# Patient Record
Sex: Female | Born: 1990 | Race: White | Hispanic: No | Marital: Single | Smoking: Former smoker
Health system: Southern US, Community
[De-identification: ages and names within clinical notes are randomized; demographics above are authoritative.]

## PROBLEM LIST (undated history)

## (undated) DIAGNOSIS — E039 Hypothyroidism, unspecified: Secondary | ICD-10-CM

## (undated) HISTORY — PX: NO PAST SURGERIES: SHX2092

---

## 2016-10-29 LAB — OB RESULTS CONSOLE GC/CHLAMYDIA
Chlamydia: NEGATIVE
Gonorrhea: NEGATIVE

## 2016-11-26 LAB — OB RESULTS CONSOLE RPR: RPR: NONREACTIVE

## 2016-11-26 LAB — OB RESULTS CONSOLE HEPATITIS B SURFACE ANTIGEN: HEP B S AG: NEGATIVE

## 2016-11-26 LAB — OB RESULTS CONSOLE ANTIBODY SCREEN: ANTIBODY SCREEN: NEGATIVE

## 2016-11-26 LAB — OB RESULTS CONSOLE ABO/RH: RH TYPE: POSITIVE

## 2016-11-26 LAB — OB RESULTS CONSOLE RUBELLA ANTIBODY, IGM: RUBELLA: NON-IMMUNE/NOT IMMUNE

## 2016-12-01 LAB — OB RESULTS CONSOLE HIV ANTIBODY (ROUTINE TESTING): HIV: NONREACTIVE

## 2017-02-24 NOTE — L&D Delivery Note (Signed)
Pt was admitted last pm secondary to bleeding. The induction was started this am. She had AROM with clear fluid. She progressed slowly to 9cm. Then pit aug was started when she did not have cervical change. She developed a temp during this time. She was given IV ampicillin. She completed the first stage and pushed for 30 min. She had a SVD of one live viable infant over a second degree midline tear. Nuchal cord x 1. Placenta -S/I . Amniotic fluid smelled of chorio. Second degree tear and labial tears closed with 3-0 chromic. Baby to nbn . EBL-400cc

## 2017-04-21 LAB — OB RESULTS CONSOLE GBS: GBS: NEGATIVE

## 2017-05-21 ENCOUNTER — Inpatient Hospital Stay (HOSPITAL_COMMUNITY)
Admission: AD | Admit: 2017-05-21 | Discharge: 2017-05-23 | DRG: 806 | Disposition: A | Discharge status: Home / Self Care | Payer: Medicaid Other | Source: Ambulatory Visit | Attending: Obstetrics and Gynecology | Admitting: Obstetrics and Gynecology

## 2017-05-21 ENCOUNTER — Inpatient Hospital Stay (HOSPITAL_COMMUNITY): Payer: Medicaid Other

## 2017-05-21 ENCOUNTER — Encounter (HOSPITAL_COMMUNITY): Payer: Self-pay

## 2017-05-21 ENCOUNTER — Inpatient Hospital Stay (HOSPITAL_COMMUNITY): Payer: Medicaid Other | Admitting: Anesthesiology

## 2017-05-21 DIAGNOSIS — O99284 Endocrine, nutritional and metabolic diseases complicating childbirth: Secondary | ICD-10-CM | POA: Diagnosis present

## 2017-05-21 DIAGNOSIS — O4693 Antepartum hemorrhage, unspecified, third trimester: Secondary | ICD-10-CM | POA: Diagnosis present

## 2017-05-21 DIAGNOSIS — Z3490 Encounter for supervision of normal pregnancy, unspecified, unspecified trimester: Secondary | ICD-10-CM

## 2017-05-21 DIAGNOSIS — E039 Hypothyroidism, unspecified: Secondary | ICD-10-CM | POA: Diagnosis present

## 2017-05-21 DIAGNOSIS — Z87891 Personal history of nicotine dependence: Secondary | ICD-10-CM | POA: Diagnosis not present

## 2017-05-21 DIAGNOSIS — Z3A39 39 weeks gestation of pregnancy: Secondary | ICD-10-CM

## 2017-05-21 HISTORY — DX: Hypothyroidism, unspecified: E03.9

## 2017-05-21 LAB — TYPE AND SCREEN
ABO/RH(D): O POS
Antibody Screen: NEGATIVE

## 2017-05-21 LAB — ABO/RH: ABO/RH(D): O POS

## 2017-05-21 LAB — CBC
HCT: 37 % (ref 36.0–46.0)
Hemoglobin: 12.9 g/dL (ref 12.0–15.0)
MCH: 30.9 pg (ref 26.0–34.0)
MCHC: 34.9 g/dL (ref 30.0–36.0)
MCV: 88.7 fL (ref 78.0–100.0)
PLATELETS: 257 10*3/uL (ref 150–400)
RBC: 4.17 MIL/uL (ref 3.87–5.11)
RDW: 13.5 % (ref 11.5–15.5)
WBC: 17.9 10*3/uL — AB (ref 4.0–10.5)

## 2017-05-21 LAB — RPR: RPR: NONREACTIVE

## 2017-05-21 MED ORDER — ONDANSETRON HCL 4 MG/2ML IJ SOLN
4.0000 mg | Freq: Four times a day (QID) | INTRAMUSCULAR | Status: DC | PRN
  Administered 2017-05-21: 4 mg via INTRAVENOUS
  Filled 2017-05-21: qty 2

## 2017-05-21 MED ORDER — COCONUT OIL OIL
1.0000 "application " | TOPICAL_OIL | Status: DC | PRN
  Administered 2017-05-23: 1 via TOPICAL
  Filled 2017-05-21: qty 120

## 2017-05-21 MED ORDER — LIDOCAINE HCL (PF) 1 % IJ SOLN
30.0000 mL | INTRAMUSCULAR | Status: DC | PRN
  Filled 2017-05-21: qty 30

## 2017-05-21 MED ORDER — ACETAMINOPHEN 325 MG PO TABS: 650.0000 mg | ORAL_TABLET | ORAL | Status: DC | PRN

## 2017-05-21 MED ORDER — ZOLPIDEM TARTRATE 5 MG PO TABS: 5.0000 mg | ORAL_TABLET | Freq: Every evening | ORAL | Status: DC | PRN

## 2017-05-21 MED ORDER — FENTANYL 2.5 MCG/ML BUPIVACAINE 1/10 % EPIDURAL INFUSION (WH - ANES)
14.0000 mL/h | INTRAMUSCULAR | Status: DC | PRN
  Administered 2017-05-21 (×2): 14 mL/h via EPIDURAL
  Filled 2017-05-21 (×2): qty 100

## 2017-05-21 MED ORDER — EPHEDRINE 5 MG/ML INJ: 10.0000 mg | INTRAVENOUS | Status: DC | PRN

## 2017-05-21 MED ORDER — OXYCODONE-ACETAMINOPHEN 5-325 MG PO TABS: 2.0000 | ORAL_TABLET | ORAL | Status: DC | PRN

## 2017-05-21 MED ORDER — OXYTOCIN BOLUS FROM INFUSION
500.0000 mL | Freq: Once | INTRAVENOUS | Status: AC
  Administered 2017-05-21: 500 mL via INTRAVENOUS

## 2017-05-21 MED ORDER — LIDOCAINE HCL (PF) 1 % IJ SOLN
INTRAMUSCULAR | Status: DC | PRN
  Administered 2017-05-21: 2 mL
  Administered 2017-05-21: 3 mL
  Administered 2017-05-21: 5 mL

## 2017-05-21 MED ORDER — LACTATED RINGERS IV SOLN: 500.0000 mL | Freq: Once | INTRAVENOUS | Status: DC

## 2017-05-21 MED ORDER — OXYCODONE-ACETAMINOPHEN 5-325 MG PO TABS: 1.0000 | ORAL_TABLET | ORAL | Status: DC | PRN

## 2017-05-21 MED ORDER — BUPIVACAINE HCL (PF) 0.25 % IJ SOLN
INTRAMUSCULAR | Status: DC | PRN
  Administered 2017-05-21: 5 mL via EPIDURAL

## 2017-05-21 MED ORDER — BENZOCAINE-MENTHOL 20-0.5 % EX AERO
1.0000 "application " | INHALATION_SPRAY | CUTANEOUS | Status: DC | PRN
  Filled 2017-05-21: qty 56

## 2017-05-21 MED ORDER — ONDANSETRON HCL 4 MG/2ML IJ SOLN: 4.0000 mg | INTRAMUSCULAR | Status: DC | PRN

## 2017-05-21 MED ORDER — LACTATED RINGERS IV SOLN
INTRAVENOUS | Status: DC
  Administered 2017-05-21: 04:00:00 via INTRAVENOUS

## 2017-05-21 MED ORDER — DIPHENHYDRAMINE HCL 50 MG/ML IJ SOLN: 12.5000 mg | INTRAMUSCULAR | Status: DC | PRN

## 2017-05-21 MED ORDER — PHENYLEPHRINE 40 MCG/ML (10ML) SYRINGE FOR IV PUSH (FOR BLOOD PRESSURE SUPPORT): 80.0000 ug | PREFILLED_SYRINGE | INTRAVENOUS | Status: DC | PRN

## 2017-05-21 MED ORDER — OXYTOCIN 40 UNITS IN LACTATED RINGERS INFUSION - SIMPLE MED
2.5000 [IU]/h | INTRAVENOUS | Status: DC
  Filled 2017-05-21: qty 1000

## 2017-05-21 MED ORDER — OXYTOCIN 10 UNIT/ML IJ SOLN
10.0000 [IU] | Freq: Once | INTRAMUSCULAR | Status: DC | PRN
  Filled 2017-05-21: qty 1

## 2017-05-21 MED ORDER — TETANUS-DIPHTH-ACELL PERTUSSIS 5-2.5-18.5 LF-MCG/0.5 IM SUSP: 0.5000 mL | Freq: Once | INTRAMUSCULAR | Status: DC

## 2017-05-21 MED ORDER — SENNOSIDES-DOCUSATE SODIUM 8.6-50 MG PO TABS
2.0000 | ORAL_TABLET | ORAL | Status: DC
  Administered 2017-05-22 (×2): 2 via ORAL
  Filled 2017-05-21: qty 2

## 2017-05-21 MED ORDER — SODIUM CHLORIDE 0.9 % IV SOLN
2.0000 g | Freq: Four times a day (QID) | INTRAVENOUS | Status: DC
  Administered 2017-05-21: 2 g via INTRAVENOUS
  Filled 2017-05-21: qty 2000
  Filled 2017-05-21: qty 2

## 2017-05-21 MED ORDER — ONDANSETRON HCL 4 MG PO TABS: 4.0000 mg | ORAL_TABLET | ORAL | Status: DC | PRN

## 2017-05-21 MED ORDER — FENTANYL CITRATE (PF) 100 MCG/2ML IJ SOLN
100.0000 ug | INTRAMUSCULAR | Status: DC | PRN
  Administered 2017-05-21: 100 ug via INTRAVENOUS
  Filled 2017-05-21: qty 2

## 2017-05-21 MED ORDER — EPHEDRINE 5 MG/ML INJ
10.0000 mg | INTRAVENOUS | Status: DC | PRN
  Filled 2017-05-21: qty 2

## 2017-05-21 MED ORDER — PHENYLEPHRINE 40 MCG/ML (10ML) SYRINGE FOR IV PUSH (FOR BLOOD PRESSURE SUPPORT)
80.0000 ug | PREFILLED_SYRINGE | INTRAVENOUS | Status: DC | PRN
  Filled 2017-05-21: qty 5

## 2017-05-21 MED ORDER — TERBUTALINE SULFATE 1 MG/ML IJ SOLN
0.2500 mg | Freq: Once | INTRAMUSCULAR | Status: DC | PRN
  Filled 2017-05-21: qty 1

## 2017-05-21 MED ORDER — IBUPROFEN 600 MG PO TABS
600.0000 mg | ORAL_TABLET | Freq: Four times a day (QID) | ORAL | Status: DC
  Administered 2017-05-22 – 2017-05-23 (×7): 600 mg via ORAL
  Filled 2017-05-21 (×6): qty 1

## 2017-05-21 MED ORDER — ACETAMINOPHEN 325 MG PO TABS
650.0000 mg | ORAL_TABLET | ORAL | Status: DC | PRN
  Administered 2017-05-21: 650 mg via ORAL
  Filled 2017-05-21: qty 2

## 2017-05-21 MED ORDER — OXYTOCIN 40 UNITS IN LACTATED RINGERS INFUSION - SIMPLE MED
1.0000 m[IU]/min | INTRAVENOUS | Status: DC
  Administered 2017-05-21: 2 m[IU]/min via INTRAVENOUS

## 2017-05-21 MED ORDER — LEVOTHYROXINE SODIUM 75 MCG PO TABS
75.0000 ug | ORAL_TABLET | Freq: Every day | ORAL | Status: DC
  Administered 2017-05-22 – 2017-05-23 (×2): 75 ug via ORAL
  Filled 2017-05-21 (×3): qty 1

## 2017-05-21 MED ORDER — LACTATED RINGERS IV SOLN
500.0000 mL | INTRAVENOUS | Status: DC | PRN
  Administered 2017-05-21: 1000 mL via INTRAVENOUS

## 2017-05-21 MED ORDER — WITCH HAZEL-GLYCERIN EX PADS: 1.0000 "application " | MEDICATED_PAD | CUTANEOUS | Status: DC | PRN

## 2017-05-21 MED ORDER — SIMETHICONE 80 MG PO CHEW: 80.0000 mg | CHEWABLE_TABLET | ORAL | Status: DC | PRN

## 2017-05-21 MED ORDER — DIBUCAINE 1 % RE OINT: 1.0000 "application " | TOPICAL_OINTMENT | RECTAL | Status: DC | PRN

## 2017-05-21 MED ORDER — SOD CITRATE-CITRIC ACID 500-334 MG/5ML PO SOLN: 30.0000 mL | ORAL | Status: DC | PRN

## 2017-05-21 MED ORDER — MEASLES, MUMPS & RUBELLA VAC ~~LOC~~ INJ
0.5000 mL | INJECTION | Freq: Once | SUBCUTANEOUS | Status: AC
  Administered 2017-05-23: 0.5 mL via SUBCUTANEOUS
  Filled 2017-05-21: qty 0.5

## 2017-05-21 MED ORDER — PHENYLEPHRINE 40 MCG/ML (10ML) SYRINGE FOR IV PUSH (FOR BLOOD PRESSURE SUPPORT)
80.0000 ug | PREFILLED_SYRINGE | INTRAVENOUS | Status: DC | PRN
  Filled 2017-05-21: qty 5
  Filled 2017-05-21: qty 10

## 2017-05-21 NOTE — Progress Notes (Signed)
10 instruments 5 big sponges 5 small sponges 2 injectables MO 

## 2017-05-21 NOTE — Anesthesia Procedure Notes (Signed)
Epidural Patient location during procedure: OB Start time: 05/21/2017 10:24 AM End time: 05/21/2017 10:29 AM  Staffing Anesthesiologist: Marcene DuosFitzgerald, Yania Bogie, MD Performed: anesthesiologist   Preanesthetic Checklist Completed: patient identified, site marked, surgical consent, pre-op evaluation, timeout performed, IV checked, risks and benefits discussed and monitors and equipment checked  Epidural Patient position: sitting Prep: site prepped and draped and DuraPrep Patient monitoring: continuous pulse ox and blood pressure Approach: midline Location: L4-L5 Injection technique: LOR air  Needle:  Needle type: Tuohy  Needle gauge: 17 G Needle length: 9 cm and 9 Needle insertion depth: 4.5 cm Catheter type: closed end flexible Catheter size: 19 Gauge Catheter at skin depth: 9.5 cm Test dose: negative  Assessment Events: blood not aspirated, injection not painful, no injection resistance, negative IV test and no paresthesia

## 2017-05-21 NOTE — MAU Provider Note (Signed)
History     CSN: [id number]  Arrival date and time: 05/21/17 [id number]   First Provider Initiated Contact with Patient 05/21/17 [phone]      Chief Complaint  Patient presents with  . Labor Eval   HPI  Ms.  Amber Mora is a 27 y.o. year old 532P0010 female at 3340w3d weeks gestation who presents to MAU reporting VB that she noticed when she woke up at 0100 this morning. She reports small clots in the toilet. She reports irregular UC's and decreased FM today. She also reports lower back pain and H/A on RT side. [facility] She was dilated 3 cm at her last OB visit.  Past Medical History:  Diagnosis Date  . Hypothyroidism     Past Surgical History:  Procedure Laterality Date  . NO PAST SURGERIES      Family History  Problem Relation Age of Onset  . Alcohol abuse Neg Hx   . Birth defects Neg Hx   . Arthritis Neg Hx   . Asthma Neg Hx   . Cancer Neg Hx   . COPD Neg Hx   . Drug abuse Neg Hx   . Early death Neg Hx   . Depression Neg Hx   . Diabetes Neg Hx   . Hearing loss Neg Hx   . Heart disease Neg Hx   . Hyperlipidemia Neg Hx   . Kidney disease Neg Hx   . Hypertension Neg Hx   . Learning disabilities Neg Hx   . Mental illness Neg Hx   . Mental retardation Neg Hx   . Stroke Neg Hx   . Miscarriages / Stillbirths Neg Hx   . Vision loss Neg Hx   . Varicose Veins Neg Hx     Social History   Tobacco Use  . Smoking status: Former Games developermoker  . Smokeless tobacco: Former Engineer, waterUser  Substance Use Topics  . Alcohol use: Not Currently    Frequency: Never  . Drug use: Not Currently    Allergies: No Known Allergies  Medications Prior to Admission  Medication Sig Dispense Refill Last Dose  . levothyroxine (SYNTHROID, LEVOTHROID) 75 MCG tablet Take 75 mcg by mouth daily before breakfast.   05/20/2017 at Unknown time  . Prenatal Vit-Fe Fumarate-FA (PRENATAL MULTIVITAMIN) TABS tablet Take 1 tablet by mouth daily at 12 noon.   05/20/2017 at Unknown time    Review of Systems   Constitutional: Negative.   HENT: Negative.   Eyes: Negative.   Respiratory: Negative.   Cardiovascular: Negative.   Gastrointestinal: Positive for abdominal pain.  Endocrine: Negative.   Genitourinary: Positive for pelvic pain and vaginal bleeding.  Musculoskeletal: Negative.   Skin: Negative.   Allergic/Immunologic: Negative.   Neurological: Negative.   Hematological: Negative.   Psychiatric/Behavioral: Negative.    Physical Exam   Blood pressure 108/79, pulse (!) 118, temperature 98.6 F (37 C), temperature source Oral.  Physical Exam  Nursing note and vitals reviewed. Constitutional: She is oriented to person, place, and time. She appears well-developed and well-nourished.  HENT:  Head: Normocephalic and atraumatic.  Eyes: Pupils are equal, round, and reactive to light.  Neck: Normal range of motion.  Cardiovascular: Normal rate, regular rhythm, normal heart sounds and intact distal pulses.  Respiratory: Effort normal and breath sounds normal.  GI: Soft. Bowel sounds are normal.  Genitourinary:  Genitourinary Comments: SE: no active bleeding, mucousy bloody show only Dilation: 4.0 Effacement (%): 80 Station: -2 Presentation: Vertex Exam by: Amber Mora. Amber Mora, CNM  Musculoskeletal: Normal range of motion.  Neurological: She is alert and oriented to person, place, and time. She has normal reflexes.  Skin: Skin is warm and dry.  Psychiatric: She has a normal mood and affect. Her behavior is normal. Judgment and thought content normal.    MAU Course  Procedures  MDM NST - FHR: 145 bpm / moderate variability / accels present / decels absent / TOCO: regular every 1-2 mins BPP: 6/8 (off for no breathing)   *Consult with Dr. Claiborne Billings @ [phone] - notified of patient's complaints, assessments, lab & U/S results - orders received to admit the pt, pt may have epidural upon request  Assessment and Plan  Indication for care in labor or delivery - Admit to L&D - Routine L&D  admission orders - May have epidural upon request   Amber Mora, MSN, CNM 05/21/2017, 2:23 AM

## 2017-05-21 NOTE — Anesthesia Preprocedure Evaluation (Signed)
Anesthesia Evaluation  Patient identified by MRN, date of birth, ID band Patient awake    Reviewed: Allergy & Precautions, Patient's Chart, lab work & pertinent test results  Airway Mallampati: II       Dental   Pulmonary former smoker,    Pulmonary exam normal        Cardiovascular negative cardio ROS Normal cardiovascular exam     Neuro/Psych negative neurological ROS     GI/Hepatic negative GI ROS, Neg liver ROS,   Endo/Other  Hypothyroidism   Renal/GU negative Renal ROS     Musculoskeletal   Abdominal   Peds  Hematology negative hematology ROS (+)   Anesthesia Other Findings   Reproductive/Obstetrics (+) Pregnancy                             Lab Results  Component Value Date   WBC 17.9 (H) 05/21/2017   HGB 12.9 05/21/2017   HCT 37.0 05/21/2017   MCV 88.7 05/21/2017   PLT 257 05/21/2017    Anesthesia Physical Anesthesia Plan  ASA: II  Anesthesia Plan: Epidural   Post-op Pain Management:    Induction:   PONV Risk Score and Plan: Treatment may vary due to age or medical condition  Airway Management Planned: Natural Airway  Additional Equipment:   Intra-op Plan:   Post-operative Plan:   Informed Consent: I have reviewed the patients History and Physical, chart, labs and discussed the procedure including the risks, benefits and alternatives for the proposed anesthesia with the patient or authorized representative who has indicated his/her understanding and acceptance.     Plan Discussed with:   Anesthesia Plan Comments:         Anesthesia Quick Evaluation

## 2017-05-21 NOTE — Anesthesia Pain Management Evaluation Note (Signed)
  CRNA Pain Management Visit Note  Patient: Desma MaximCaroline Scaduto, 27 y.o., female  "Hello [facility] am a [facility]. We have an anesthesia team available at all times to provide care throughout the hospital, including epidural management and anesthesia for C-section. I don't know your plan for the delivery whether it a natural birth, water birth, IV sedation, nitrous supplementation, doula or epidural, but we want to meet your pain goals."   1.Was your pain managed to your expectations on prior hospitalizations?   No prior hospitalizations  2.What is your expectation for pain management during this hospitalization?     Labor support without medications  3.How can we help you reach that goal? naturlal  Record the patient's initial score and the patient's pain goal.   Pain: 6  Pain Goal: 8 [facility] wants you to be able to say your pain was always managed very well.  Rasheen Bells 05/21/2017

## 2017-05-21 NOTE — H&P (Signed)
Pt is a  27 y.o. G2P0010 2229w3d white female who was admitted last PM with vaginal bleeding. PNC was complicated by hypothyroidism. She declined genetic testing. Nl OGTT Chief Complaint: HPI:  Past Medical History:  Diagnosis Date  . Hypothyroidism     Past Surgical History:  Procedure Laterality Date  . NO PAST SURGERIES      Family History  Problem Relation Age of Onset  . Alcohol abuse Neg Hx   . Birth defects Neg Hx   . Arthritis Neg Hx   . Asthma Neg Hx   . Cancer Neg Hx   . COPD Neg Hx   . Drug abuse Neg Hx   . Early death Neg Hx   . Depression Neg Hx   . Diabetes Neg Hx   . Hearing loss Neg Hx   . Heart disease Neg Hx   . Hyperlipidemia Neg Hx   . Kidney disease Neg Hx   . Hypertension Neg Hx   . Learning disabilities Neg Hx   . Mental illness Neg Hx   . Mental retardation Neg Hx   . Stroke Neg Hx   . Miscarriages / Stillbirths Neg Hx   . Vision loss Neg Hx   . Varicose Veins Neg Hx    Social History:  reports that she has quit smoking. She has quit using smokeless tobacco. She reports that she drank alcohol. She reports that she has current or past drug history.  Allergies: No Known Allergies  Medications Prior to Admission  Medication Sig Dispense Refill  . levothyroxine (SYNTHROID, LEVOTHROID) 75 MCG tablet Take 75-112.5 mcg by mouth daily before breakfast. 75mcg daily and 112.1025mcg (1.5 tablets) x 2 days per week.         Blood pressure (!) 109/47, pulse (!) 115, temperature (!) 102.9 F (39.4 C), temperature source Oral, resp. rate 18, height 5\' 5"  (1.651 m), weight 160 lb (72.6 kg), SpO2 98 %. General appearance: alert and cooperative Abdomen: gravid/nontender   Lab Results  Component Value Date   WBC 17.9 (H) 05/21/2017   HGB 12.9 05/21/2017   HCT 37.0 05/21/2017   MCV 88.7 05/21/2017   PLT 257 05/21/2017   No results found for: PREGTESTUR, PREGSERUM, HCG, HCGQUANT     Patient Active Problem List   Diagnosis Date Noted  . Indication  for care in labor or delivery 05/21/2017  IMP/ IUP at term Plan/ Start induction  Mishel Sans E 05/21/2017, 6:00 PM

## 2017-05-21 NOTE — MAU Note (Signed)
Pt presents to MAU c/o vaginal bleeding. Pt states around 0100 she woke up to use the bathroom and noticed some small clots of blood in the toilet. Pt reports irregular ctxs and decreased FM. Pt aslo reports lower back pain and a HA on her right side.

## 2017-05-22 LAB — CBC
HEMATOCRIT: 32.7 % — AB (ref 36.0–46.0)
HEMOGLOBIN: 11 g/dL — AB (ref 12.0–15.0)
MCH: 30.6 pg (ref 26.0–34.0)
MCHC: 33.6 g/dL (ref 30.0–36.0)
MCV: 90.8 fL (ref 78.0–100.0)
PLATELETS: 231 10*3/uL (ref 150–400)
RBC: 3.6 MIL/uL — AB (ref 3.87–5.11)
RDW: 14 % (ref 11.5–15.5)
WBC: 23.2 10*3/uL — AB (ref 4.0–10.5)

## 2017-05-22 NOTE — Anesthesia Postprocedure Evaluation (Signed)
Anesthesia Post Note  Patient: Amber Mora  Procedure(s) Performed: AN AD HOC LABOR EPIDURAL     Patient location during evaluation: Mother Baby Anesthesia Type: Epidural Level of consciousness: awake Pain management: satisfactory to patient Vital Signs Assessment: post-procedure vital signs reviewed and stable Respiratory status: spontaneous breathing Cardiovascular status: stable Anesthetic complications: no    Last Vitals:  Vitals:   05/22/17 0530 05/22/17 0814  BP: (!) 111/59 100/60  Pulse: 69 74  Resp: 18   Temp: 36.6 C 36.6 C  SpO2:  100%    Last Pain:  Vitals:   05/22/17 0815  TempSrc:   PainSc: 4    Pain Goal: Patients Stated Pain Goal: 5 (05/21/17 1100)               Cephus ShellingBURGER,Leya Paige

## 2017-05-22 NOTE — Progress Notes (Signed)
Post Partum Day 1 Subjective: no complaints, up ad lib, voiding and tolerating PO  Objective: Blood pressure 100/60, pulse 74, temperature 97.9 F (36.6 C), resp. rate 18, height 5\' 5"  (1.651 m), weight 72.6 kg (160 lb), SpO2 100 %, unknown if currently breastfeeding.  Physical Exam:  General: alert, cooperative and appears stated age Lochia: appropriate Uterine Fundus: firm   Recent Labs    05/21/17 0330 05/22/17 0622  HGB 12.9 11.0*  HCT 37.0 32.7*    Assessment/Plan: Plan for discharge tomorrow and Breastfeeding   LOS: 1 day   Amber Mora 05/22/2017, 9:22 AM

## 2017-05-22 NOTE — Lactation Note (Signed)
This note was copied from a baby's chart. Lactation Consultation Note  Patient Name: Amber Desma MaximCaroline Mora ZOXWR'UToday's Date: 05/22/2017 Reason for consult: Initial assessment;Term;[facility] Breastfeeding consultation services and support information given to patient.  Baby is 5118 hours old and has latched once for 30 minutes.  Baby in a quiet alert state now.  Positioned baby in football hold and mom expressed a drop of colostrum.  Mom has erect nipples and compressible breast. Baby took a few sucks off and on but would not sustain a latch.  Baby would not suck on gloved finger and brings her tongue to the roof of her mouth.  Showed mom suck training and encouraged her to do this.  A few drops expressed in spoon and given to baby.  I will follow up this afternoon and if no progress with latching set up a DEBP.  Maternal Data Has patient been taught Hand Expression?: Yes Does the patient have breastfeeding experience prior to this delivery?: No  Feeding Feeding Type: Breast Fed  LATCH Score Latch: Too sleepy or reluctant, no latch achieved, no sucking elicited.  Audible Swallowing: None  Type of Nipple: Everted at rest and after stimulation  Comfort (Breast/Nipple): Soft / non-tender  Hold (Positioning): Assistance needed to correctly position infant at breast and maintain latch.  LATCH Score: 5  Interventions Interventions: Breast feeding basics reviewed;Assisted with latch;Skin to skin;Adjust position;Breast massage;Support pillows;Hand express;Position options  Lactation Tools Discussed/Used     Consult Status Consult Status: Follow-up Date: 05/22/17 Follow-up type: In-patient    [name], Pearly Bartosik S 05/22/2017, 11:25 AM

## 2017-05-23 MED ORDER — IBUPROFEN 600 MG PO TABS: 600.0000 mg | ORAL_TABLET | Freq: Four times a day (QID) | ORAL | 0 refills | Status: AC | PRN

## 2017-05-23 MED ORDER — OXYCODONE-ACETAMINOPHEN 5-325 MG PO TABS: 1.0000 | ORAL_TABLET | Freq: Four times a day (QID) | ORAL | 0 refills | Status: AC | PRN

## 2017-05-23 MED ORDER — DOCUSATE SODIUM 100 MG PO CAPS: 100.0000 mg | ORAL_CAPSULE | Freq: Two times a day (BID) | ORAL | 0 refills | Status: AC

## 2017-05-23 NOTE — Lactation Note (Signed)
This note was copied from a baby's chart. Lactation Consultation Note  Patient Name: Amber Mora ZOXWR'UToday's Date: 05/23/2017 Reason for consult: Follow-up assessment  Mom and baby are going home today. She's a P1, baby is 2244 hours old and at 7% weight loss. Event though RN gave a latch score of 4, per mom BF is getting better, she voiced that baby did get to latch on the last feeding and that she heard swallows this time. Unable to assess the latch at this point since baby just fed at mom was already dressed waiting for her family to take her home.  Reviewed red flags to monitor to call baby's pediatrician. Discussed how a deep latch looks like, feels like and how important is for milk transfer. Engorgement prevention and treatment were also discussed. Offered OP appt, mom has LC OP # and will call if needed.   LATCH Score (per RN) Latch: Too sleepy or reluctant, no latch achieved, no sucking elicited.  Audible Swallowing: None  Type of Nipple: Everted at rest and after stimulation  Comfort (Breast/Nipple): Filling, red/small blisters or bruises, mild/mod discomfort  Hold (Positioning): Assistance needed to correctly position infant at breast and maintain latch.  LATCH Score: 4  Interventions Interventions: Breast feeding basics reviewed  Lactation Tools Discussed/Used     Consult Status Consult Status: PRN Follow-up type: Call as needed    [name] S [name] 05/23/2017, 1:16 PM

## 2017-05-23 NOTE — Discharge Summary (Signed)
Obstetric Discharge Summary Reason for Admission: onset of labor Prenatal Procedures: ultrasound Intrapartum Procedures: spontaneous vaginal delivery Postpartum Procedures: none Complications-Operative and Postpartum: 2nd degree perineal laceration Hemoglobin  Date Value Ref Range Status  05/22/2017 11.0 (L) 12.0 - 15.0 g/dL Final   HCT  Date Value Ref Range Status  05/22/2017 32.7 (L) 36.0 - 46.0 % Final    Physical Exam:  General: alert, cooperative and appears stated age 4Lochia: appropriate Uterine Fundus: firm Incision: healing well DVT Evaluation: No evidence of DVT seen on physical exam.  Discharge Diagnoses: Term Pregnancy-delivered  Discharge Information: Date: 05/23/2017 Activity: pelvic rest Diet: routine Medications: Ibuprofen, Colace and Percocet Condition: improved Instructions: refer to practice specific booklet Discharge to: home Follow-up Information    Levi AlandAnderson, Mark E, MD Follow up in 4 week(s).   Specialty:  Obstetrics and Gynecology Why:  For a postpartum evaluation Contact information: [address] [place] [phone]           Newborn Data: Live born female  Birth Weight: 7 lb 7.4 oz (3385 g) APGAR: 9, 9  Newborn Delivery   Birth date/time:  05/21/2017 17:05:00 Delivery type:  Vaginal, Spontaneous     Home with mother.  Amber Mora 05/23/2017, 11:41 AM

## 2018-01-06 ENCOUNTER — Emergency Department (HOSPITAL_COMMUNITY): Payer: Medicaid Other

## 2018-01-06 ENCOUNTER — Encounter (HOSPITAL_COMMUNITY): Payer: Self-pay | Admitting: Emergency Medicine

## 2018-01-06 ENCOUNTER — Emergency Department (HOSPITAL_COMMUNITY)
Admission: EM | Admit: 2018-01-06 | Discharge: 2018-01-06 | Disposition: A | Discharge status: Home / Self Care | Payer: Medicaid Other | Attending: Emergency Medicine | Admitting: Emergency Medicine

## 2018-01-06 ENCOUNTER — Other Ambulatory Visit: Payer: Self-pay

## 2018-01-06 DIAGNOSIS — M25531 Pain in right wrist: Secondary | ICD-10-CM | POA: Diagnosis present

## 2018-01-06 DIAGNOSIS — Z87891 Personal history of nicotine dependence: Secondary | ICD-10-CM | POA: Diagnosis not present

## 2018-01-06 DIAGNOSIS — Z79899 Other long term (current) drug therapy: Secondary | ICD-10-CM | POA: Diagnosis not present

## 2018-01-06 DIAGNOSIS — E039 Hypothyroidism, unspecified: Secondary | ICD-10-CM | POA: Diagnosis not present

## 2018-01-06 NOTE — Discharge Instructions (Addendum)
Today your x-rays did not show any evidence of broken bones.  As we discussed this is most likely related to inflammation.  Please take Ibuprofen (Advil, motrin) and Tylenol (acetaminophen) to relieve your pain.  You may take up to 600 MG (3 pills) of normal strength ibuprofen every 8 hours as needed.  In between doses of ibuprofen you make take tylenol, up to 1,000 mg (two extra strength pills).  Do not take more than 3,000 mg tylenol in a 24 hour period.  Please check all medication labels as many medications such as pain and cold medications may contain tylenol.  Do not drink alcohol while taking these medications.  Do not take other NSAID'S while taking ibuprofen (such as aleve or naproxen).  Please take ibuprofen with food to decrease stomach upset.

## 2018-01-06 NOTE — ED Triage Notes (Signed)
Patient complaining of right wrist pain that has been hurting for a couple months. Patient states she came because it is getting worse. Patient has not had an injury to her wrist.

## 2018-01-06 NOTE — ED Provider Notes (Signed)
[facility]-EMERGENCY DEPT Provider Note   CSN: [id number] Arrival date & time: 01/06/18  2120     History   Chief Complaint Chief Complaint  Patient presents with  . Wrist Pain    HPI Amber Mora is a 27 y.o. female who presents today for evaluation of right wrist pain.  She reports that for the past 4 months she has had waxing and waning pain at the dorsal base of her right thumb and her right wrist.  She has tried an Ace wrap which provided temporary mild relief however returned.  She denies any injury prior to the onset of her symptoms.  No fevers.  She does not have any other joint pains.  She indicates pain is   HPI  Past Medical History:  Diagnosis Date  . Hypothyroidism     Patient Active Problem List   Diagnosis Date Noted  . Indication for care in labor or delivery 05/21/2017  . Pregnancy 05/21/2017    Past Surgical History:  Procedure Laterality Date  . NO PAST SURGERIES       OB History    Gravida  2   Para  1   Term  1   Preterm      AB  1   Living  1     SAB      TAB      Ectopic      Multiple  0   Live Births  1            Home Medications    Prior to Admission medications   Medication Sig Start Date End Date Taking? Authorizing Provider  docusate sodium (COLACE) 100 MG capsule Take 1 capsule (100 mg total) by mouth 2 (two) times daily. 05/23/17   Waynard Reedsoss, Kendra, MD  ibuprofen (ADVIL,MOTRIN) 600 MG tablet Take 1 tablet (600 mg total) by mouth every 6 (six) hours as needed. 05/23/17   Waynard Reedsoss, Kendra, MD  levothyroxine (SYNTHROID, LEVOTHROID) 75 MCG tablet Take 75-112.5 mcg by mouth daily before breakfast. 75mcg daily and 112.825mcg (1.5 tablets) x 2 days per week.    [provider]  oxyCODONE-acetaminophen (PERCOCET/ROXICET) 5-325 MG tablet Take 1-2 tablets by mouth every 6 (six) hours as needed for severe pain. 05/23/17   Waynard Reedsoss, Kendra, MD    Family History Family History  Problem Relation Age of  Onset  . Alcohol abuse Neg Hx   . Birth defects Neg Hx   . Arthritis Neg Hx   . Asthma Neg Hx   . Cancer Neg Hx   . COPD Neg Hx   . Drug abuse Neg Hx   . Early death Neg Hx   . Depression Neg Hx   . Diabetes Neg Hx   . Hearing loss Neg Hx   . Heart disease Neg Hx   . Hyperlipidemia Neg Hx   . Kidney disease Neg Hx   . Hypertension Neg Hx   . Learning disabilities Neg Hx   . Mental illness Neg Hx   . Mental retardation Neg Hx   . Stroke Neg Hx   . Miscarriages / Stillbirths Neg Hx   . Vision loss Neg Hx   . Varicose Veins Neg Hx     Social History Social History   Tobacco Use  . Smoking status: Former Games developermoker  . Smokeless tobacco: Former Engineer, waterUser  Substance Use Topics  . Alcohol use: Not Currently    Frequency: Never  . Drug use: Not Currently  Allergies   Other   Review of Systems Review of Systems  Constitutional: Negative for chills and fever.  Musculoskeletal: Positive for arthralgias.  Neurological: Negative for weakness and numbness.  All other systems reviewed and are negative.    Physical Exam Updated Vital Signs BP 130/81 (BP Location: Right Arm)   Pulse (!) 58   Temp 98 F (36.7 C) (Oral)   Resp 16   Ht 5\' 6"  (1.676 m)   Wt 63.5 kg   LMP 12/13/2017   SpO2 100%   BMI 22.60 kg/m   Physical Exam  Constitutional: She appears well-developed. No distress.  Cardiovascular: Intact distal pulses.  2+ right radial pulse.  Brisk capillary refill to fingers of right hand.  Musculoskeletal:  She does not have any tenderness to palpation over the right wrist.  Her pain is reproduced with finkelstein test.  No crepitus or deformity.    Neurological:  5/5 grip strength in right hand.  Sensation intact to light touch on all fingers of right hand.  Skin: Skin is warm and dry. She is not diaphoretic.  No abnormal erythema, edema, or ecchymosis over the right hand.  Psychiatric: She has a normal mood and affect.  Nursing note and vitals  reviewed.    ED Treatments / Results  Labs (all labs ordered are listed, but only abnormal results are displayed) Labs Reviewed - No data to display  EKG None  Radiology Dg Wrist Complete Right  Result Date: 01/06/2018 CLINICAL DATA:  Right wrist pain for several months without known injury. EXAM: RIGHT WRIST - COMPLETE 3+ VIEW COMPARISON:  None. FINDINGS: There is no evidence of fracture or dislocation. There is no evidence of arthropathy or other focal bone abnormality. Soft tissues are unremarkable. IMPRESSION: Negative. Electronically Signed   By: Tollie Eth M.D.   On: 01/06/2018 22:10    Procedures Procedures (including critical care time)  Medications Ordered in ED Medications - No data to display   Initial Impression / Assessment and Plan / ED Course  I have reviewed the triage vital signs and the nursing notes.  Pertinent labs & imaging results that were available during my care of the patient were reviewed by me and considered in my medical decision making (see chart for details).    Patient presents today for evaluation of 4 months of waxing and waning pain in her right wrist.  X-rays were obtained which did not show evidence of acute abnormalities.  She has a positive Finkelstein test and history consistent with de Quervain's tenosynovitis.  She is given a removable Velcro thumb spica wrist splint, instructions on over-the-counter NSAIDs and Tylenol.  She is given follow-up with hand in case her symptoms fail to improve with conservative care.    Final Clinical Impressions(s) / ED Diagnoses   Final diagnoses:  Right wrist pain    ED Discharge Orders    None       Norman Clay 01/06/18 2336    Alvira Monday, MD 01/08/18 1106

## 2019-11-17 IMAGING — US US MFM FETAL BPP W/O NON-STRESS
1 series · 11 of 11 positions shown · non-contrast
Comparison: none

[Series 1: us mfm fetal bpp w/o non-stress · 11 acquisitions, 11 frames shown]
[im 1/11]
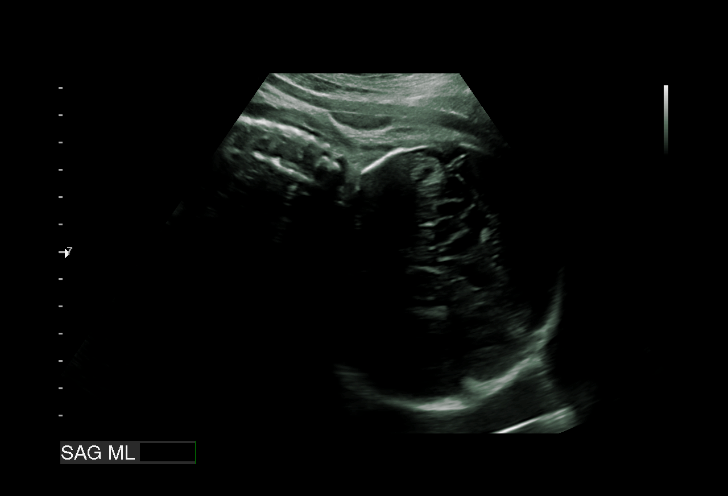
[im 2/11]
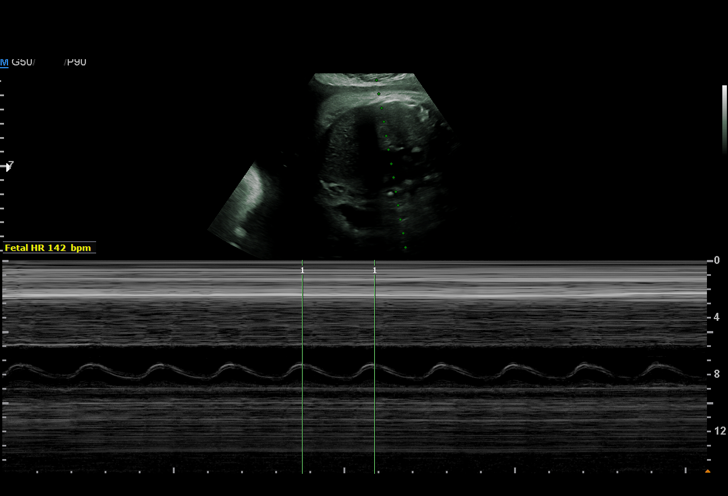
[im 3/11]
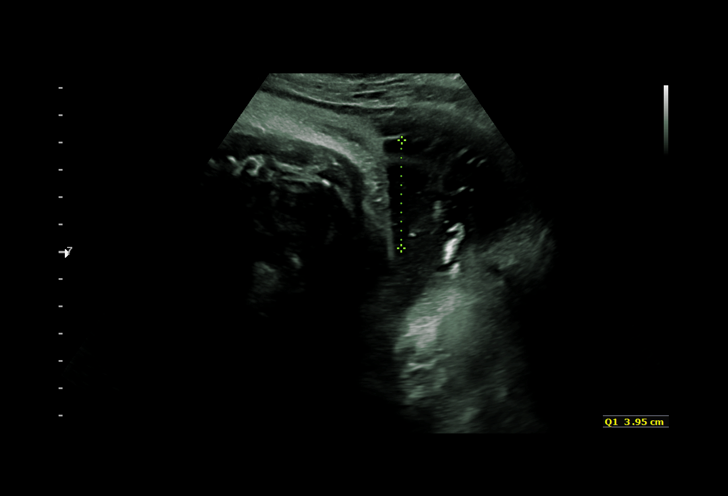
[im 4/11]
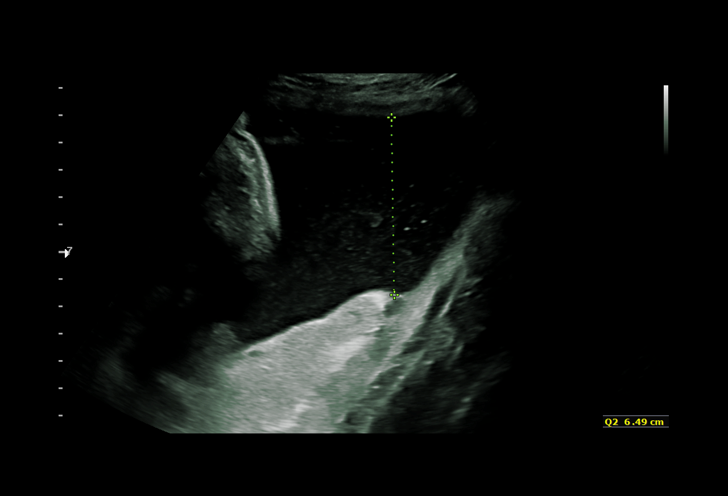
[im 5/11]
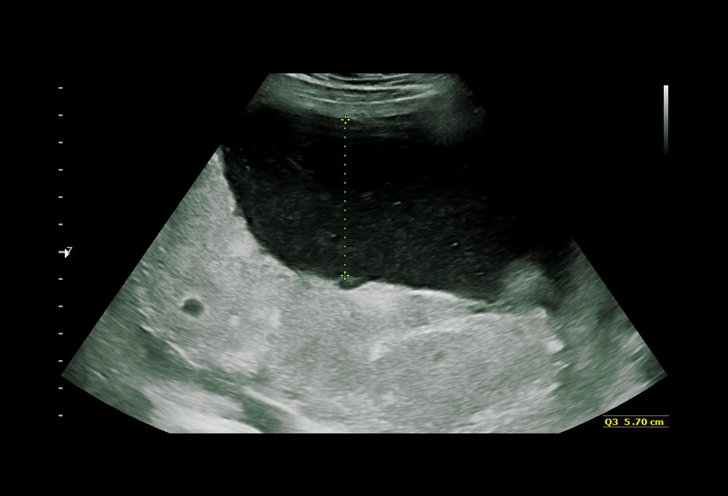
[im 6/11]
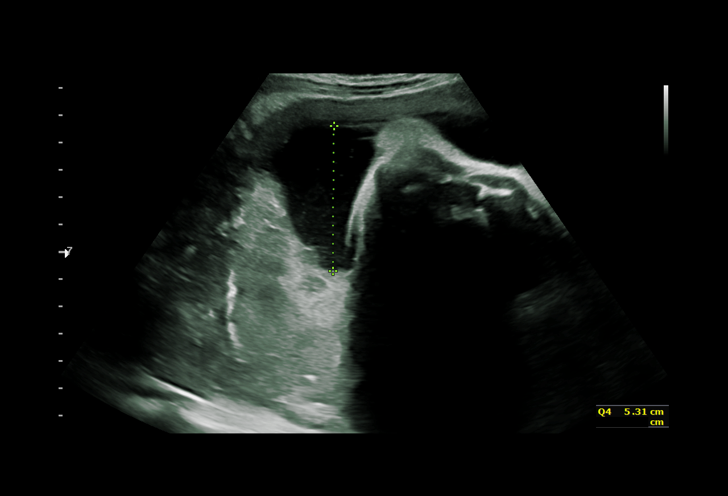
[im 7/11]
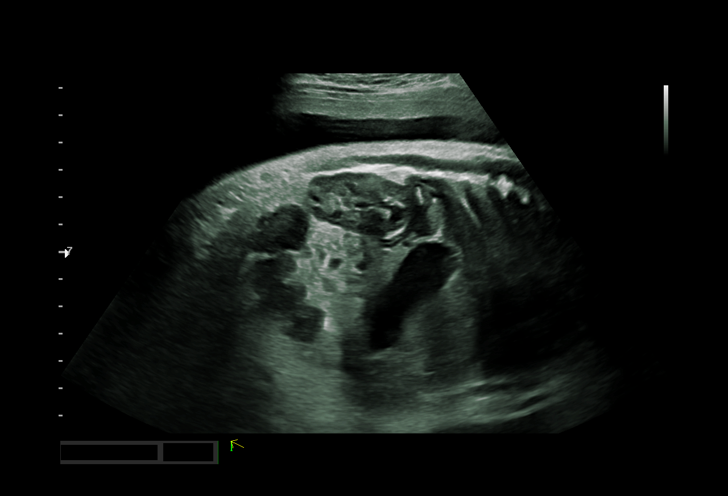
[im 8/11]
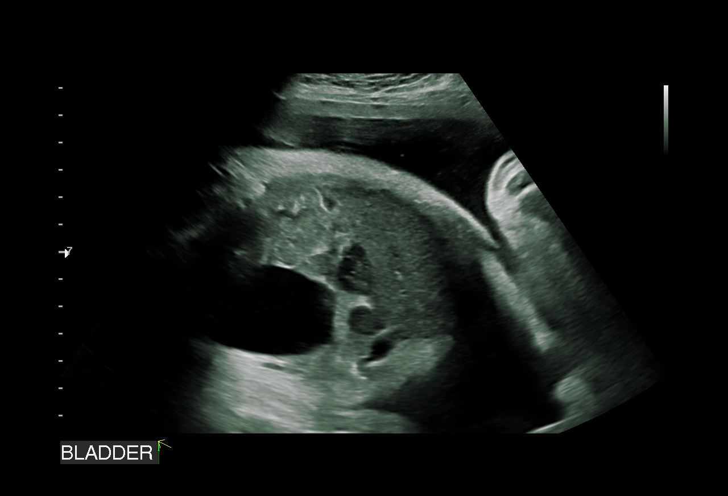
[im 9/11]
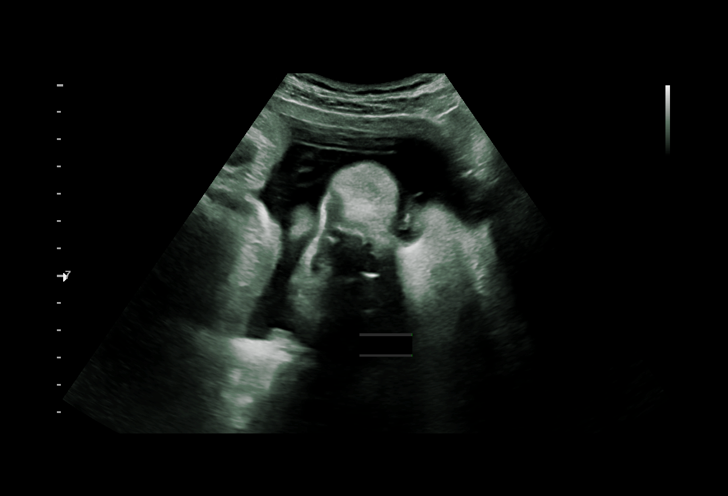
[im 10/11]
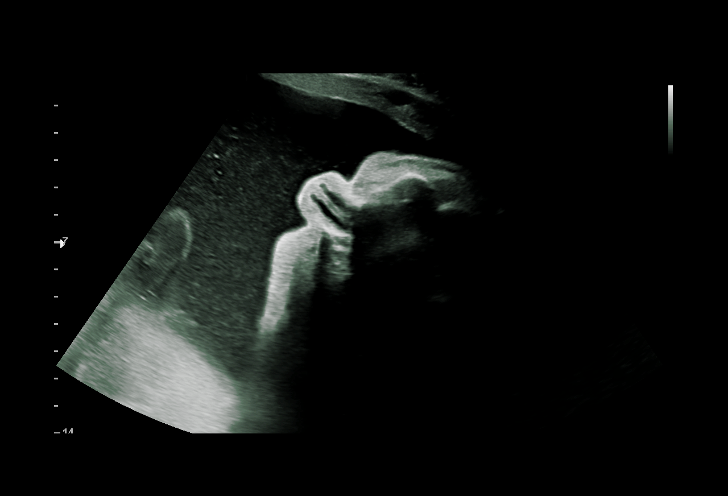
[im 11/11]
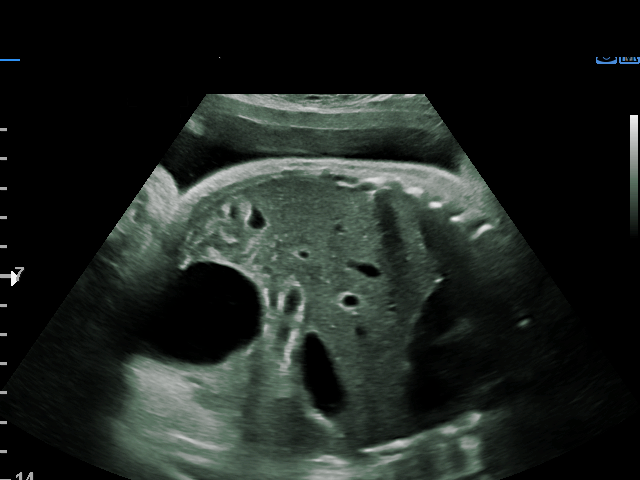

[11 of 11 positions shown; findings below may reference images not displayed]

1  SOYENCH DEM           [id number]      [id number]     [id number]
Indications

39 weeks gestation of pregnancy
Vaginal bleeding in pregnancy, third trimester
Decreased fetal movement
OB History

Gravidity:    2          SAB:   1
Living:       0
Fetal Evaluation

Num Of Fetuses:     1
Fetal Heart         142
Rate(bpm):
Cardiac Activity:   Observed
Presentation:       Cephalic

Amniotic Fluid
AFI FV:      Subjectively within normal limits

AFI Sum(cm)     %Tile       Largest Pocket(cm)
21.45           92

RUQ(cm)       RLQ(cm)       LUQ(cm)        LLQ(cm)
3.95
Biophysical Evaluation

Amniotic F.V:   Within normal limits       F. Tone:        Observed
F. Movement:    Observed                   Score:          [DATE]
F. Breathing:   Not Observed
Gestational Age

Clinical EDD:  39w 3d                                        EDD:   05/25/17
Best:          39w 3d     Det. By:  Clinical EDD             EDD:   05/25/17
Anatomy

Stomach:               Appears normal, left   Bladder:                Appears normal
sided
Impression

Intrauterine pregnancy at 39+3 weeks with vaginal bleeding
and decreased fetal movement
Normal amniotic fluid
BPP [DATE] with no fetal breathing
Recommendations

Continue clinical evaluation and management.

## 2020-05-23 LAB — CBC WITH AUTO DIFFERENTIAL
Absolute Baso #: 0 10*3/uL (ref 0.0–0.2)
Absolute Eos #: 0 10*3/uL (ref 0.0–0.5)
Absolute Lymph #: 0.9 10*3/uL — ABNORMAL LOW (ref 1.0–3.2)
Absolute Mono #: 0.4 10*3/uL (ref 0.3–1.0)
Basophils %: 0.1 % (ref 0.0–2.0)
Eosinophils %: 0 % (ref 0.0–7.0)
Hematocrit: 35.4 % (ref 34.0–47.0)
Hemoglobin: 12.2 g/dL (ref 11.5–15.7)
Immature Grans (Abs): 0.05 10*3/uL (ref 0.00–0.06)
Immature Granulocytes: 0.7 % — ABNORMAL HIGH (ref 0.0–0.6)
Lymphocytes: 12.7 % — ABNORMAL LOW (ref 15.0–45.0)
MCH: 31 pg (ref 27.0–34.5)
MCHC: 34.5 g/dL (ref 32.0–36.0)
MCV: 90.1 fL (ref 81.0–99.0)
MPV: 8.8 fL (ref 7.2–13.2)
Monocytes: 5.3 % (ref 4.0–12.0)
Neutrophils %: 81.2 % — ABNORMAL HIGH (ref 42.0–74.0)
Neutrophils Absolute: 5.8 10*3/uL (ref 1.6–7.3)
Platelets: 276 10*3/uL (ref 140–440)
RBC: 3.93 x10e6/mcL (ref 3.60–5.20)
RDW: 13 % (ref 11.0–16.0)
WBC: 7.1 10*3/uL (ref 3.8–10.6)

## 2020-05-23 LAB — URINALYSIS WITH REFLEX TO CULTURE
Blood, Urine: NEGATIVE
Glucose, UA: NEGATIVE mg/dL
Ketones, Urine: >=80 mg/dL — AB
Nitrite, Urine: NEGATIVE
Protein, UA: 30 — AB
Specific Gravity, UA: 1.025 (ref 1.003–1.035)
Urobilinogen, Urine: 1 EU/dL
pH, UA: 6.5 (ref 4.5–8.0)

## 2020-05-23 NOTE — Discharge Summary (Signed)
 ED Clinical Summary              [facility]  Post-Acute Care Transfer Instructions  PERSON INFORMATION  Name: [name] [name], [name] [name]  MRN: [id number]   FIN#: [id number]  PHYSICIANS  Admitting Physician: PIETRO HARTMANN  Attending Physician: PIETRO HARTMANN  PCP: CLAUDENE LUKES A-MD  Discharge Diagnosis:    Comment:     PATIENT EDUCATION INFORMATION  Instructions:  Form - Fetal Movement Counts  Medication Leaflets:    Follow-up:              With: Address: When:   [facility] [address] [place] [address]  [phone] Business (1) Within 1 week                MEDICATION LIST  Medication Reconciliation at Discharge:          Medications that have not changed  Other Medications  levothyroxine 0.075 Milligram Oral (given by mouth) every day.  Last Dose:____________________  multivitamin, prenatal (Prenatal 1) 1 Tabs Oral (given by mouth) every day.  Last Dose:____________________         Patient's Final Home Medication List Upon Discharge:          levothyroxine 0.075 Milligram Oral (given by mouth) every day.  multivitamin, prenatal (Prenatal 1) 1 Tabs Oral (given by mouth) every day.         Comment:     ORDERS          Order Name Order Details   Discharge Patient 05/23/20 21:06:00 EDT

## 2020-05-23 NOTE — ED Notes (Signed)
 ED Patient Summary       ;          [facility]  [address] [place], [place] [address]  [phone]  Patient Discharge Instructions  Name: [name] [name] [name]  Current Date: 05/23/2020 21:47:51  DOB: [birth year] MRN: [id number] FIN: [id number]  Patient Address: [address] [place] [address]  Patient Phone: [phone]  Primary Care Provider:  Name: CLAUDENE LUKES A-MD  Phone: [phone]    Discharge Diagnosis:   Discharged To: ANTICIPATED%>            Mode of Discharge Transportation: TRANSPORTATION%>Private vehicle  Discharge Orders          Discharge Patient 05/23/20 21:06:00 EDT      Comment:   Medications  During the course of your visit, your medication list was updated with the most current information. The details of those changes are reflected below:          Medications that have not changed  Other Medications  levothyroxine 0.075 Milligram Oral (given by mouth) every day.  Last Dose:____________________  multivitamin, prenatal (Prenatal 1) 1 Tabs Oral (given by mouth) every day.  Last Dose:____________________      [facility] would like to thank you for allowing us  to assist you with your healthcare needs. The following includes patient education materials and information regarding your injury/illness.  [name] [name] [name] has been given the following list of follow-up instructions, prescriptions, and patient education materials:  Follow-up Instructions:              With: Address: When:   [facility] [address] [place] [address]  [phone] Business (1) Within 1 week                It is important to always keep an active list of medications available so that you can share with other providers and manage your medications appropriately. As an additional courtesy, we are also providing you with your final active medications list that you can keep with you.          levothyroxine 0.075 Milligram Oral (given by mouth) every  day.  multivitamin, prenatal (Prenatal 1) 1 Tabs Oral (given by mouth) every day.      Take only the medications listed above. Contact your doctor prior to taking any medications not on this list.  ---------------------------------------------------------------------------------------------------------------------------  URGENT MATERNAL WARNING SIGNS           If you have any of these symptoms during or after pregnancy, contact your health care provider and get help right away. If you can't reach your provider, go to the emergency room.       Headache that won't go away or gets worse over time Trouble breathing Vaginal bleeding or fluid leaking during pregnancy Dizziness or fainting Chest Pain or fast beating heart   Vaginal bleeding or fluid leaking after pregnancy Thoughts about hurting yourself or your baby Severe belly pain that doesn't go away Swelling, redness or pain of your leg Changes in your vision   Severe nausea and throwing up(not like morning sickness) Extreme swelling of your hands and face Fever Baby's movements stopping or slowing during pregnancy Overwhelming tiredness       Additional tips for using this list:   * This list is not meant to cover everthing you might be experiencing. If you feel like something just isn't right, it's always best to tell your provider and get the help  you need.   * Always remember to say that you're pregnant or have been pregnant within the last year when getting help.   * Symptoms are listed from head to toe.  Go to [email or web address]  Or scan this code with your camera to download the Urgent Maternal Warning Signs.     ---------------------------------------------------------------------------------------------------------------------------  Discharge instructions, if any, will display below  Instructions for Diet: FOR DIET%>  Instructions for Supplements: SUPPLEMENT  INSTRUCTIONS%>  Instructions for Activity: INSTRUCTIONS FOR ACTIVITY%>  Instructions for Wound Care: INSTRUCTIONS FOR WOUND CARE%>  Medication leaflets, if any, will display below    Patient education materials, if any, will display below     Fetal Movement Counts    Patient Name: ________________________________________________ Patient Due Date: ____________________    What is a fetal movement count?      A fetal movement count is the number of times that you feel your baby move during a certain amount of time. This may also be called a fetal kick count. A fetal movement count is recommended for every pregnant woman. You may be asked to start counting fetal movements as early as week 28 of your pregnancy.    Pay attention to when your baby is most active. You may notice your baby's sleep and wake cycles. You may also notice things that make your baby move more. You should do a fetal movement count:   When your baby is normally most active.     At the same time each day.      A good time to count movements is while you are resting, after having something to eat and drink.      How do I count fetal movements?    1. Find a quiet, comfortable area. Sit, or lie down on your side.    2. Write down the date, the start time and stop time, and the number of movements that you felt between those two times. Take this information with you to your health care visits.    3. Write down your start time when you feel the first movement.    4. Count kicks, flutters, swishes, rolls, and jabs. You should feel at least 10 movements.    5. You may stop counting after you have felt 10 movements, or if you have been counting for 2 hours. Write down the stop time.    6. If you do not feel 10 movements in 2 hours, contact your health care provider for further instructions. Your health care provider may want to do additional tests to assess your baby's well-being.      Contact a health care provider if:     You feel fewer than 10 movements  in 2 hours.     Your baby is not moving like he or she usually does.    Date: ____________ Start time: ____________ Stop time: ____________ Movements: ____________    Date: ____________ Start time: ____________ Stop time: ____________ Movements: ____________    Date: ____________ Start time: ____________ Stop time: ____________ Movements: ____________    Date: ____________ Start time: ____________ Stop time: ____________ Movements: ____________    Date: ____________ Start time: ____________ Stop time: ____________ Movements: ____________    Date: ____________ Start time: ____________ Stop time: ____________ Movements: ____________    Date: ____________ Start time: ____________ Stop time: ____________ Movements: ____________    Date: ____________ Start time: ____________ Stop time: ____________ Movements: ____________    Date: ____________ Start time: ____________ Stop time: ____________ Movements: ____________  This information is not intended to replace advice given to you by your health care provider. Make sure you discuss any questions you have with your health care provider.      Document Revised: 09/30/2018 Document Reviewed: 09/30/2018  Elsevier Patient Education ? 2021 Elsevier Inc.         IS IT A STROKE? Act FAST and Check for these signs:  FACE          Does the face look uneven?  ARM          Does one arm drift down?  SPEECH     Does their speech sound strange?  TIME          Call 9-1-1 at any sign of stroke  --------------------------------------------------------------------------------------------------------------------------  Heart Attack Signs  Chest discomfort: Most heart attacks involve discomfort in the center of the chest and lasts more than a few minutes, or goes away and comes back. It can feel like uncomfortable pressure, squeezing, fullness or pain.  Discomfort in upper body: Symptoms can include pain or discomfort in one or both arms, back, neck, jaw or stomach.  Shortness of breath:  With or without discomfort.  Other signs: Breaking out in a cold sweat, nausea, or lightheaded.  Remember, MINUTES DO MATTER. If you experience any of these heart attack warning signs, call 9-1-1 to get immediate medical attention!  ---------------------------------------------------------------------------------------------------------------------------  [facility] allows patients to review your COVID and other test results as well as discharge documents from any [name], Emergency Department, surgical center or outpatient lab. Test results are typically available 36 hours after the test is completed.     [facility] encourages you to self-enroll in the [facility] Patient Portal.     To begin your self-enrollment process, please visit [email or web address] [facility], click on "Sign up now".     NOTE: You must be 16 years and older to use [facility] Self-Enroll online. If you are a parent, caregiver, or guardian; you need an invite to access your child's or dependent's health records. To obtain an invite, contact the Medical Records department at [phone] Monday through Friday, 8-4:30, select option 3 . If we receive your call afterhours, we will return your call the next business day.     If you have issues trying to create or access your account, contact [employer] support at [phone] available 7 days a week 24 hours a day.     Scan this code with your phone camera to download the My [facility] Baby app.

## 2020-05-23 NOTE — ED Notes (Signed)
ED Patient Education Note     Patient Education Materials Follows:  Obstetrics and Gynecology     Fetal Movement Counts    Patient Name: ________________________________________________ Patient Due Date: ____________________    What is a fetal movement count?      A fetal movement count is the number of times that you feel your baby move during a certain amount of time. This may also be called a fetal kick count. A fetal movement count is recommended for every pregnant woman. You may be asked to start counting fetal movements as early as week 28 of your pregnancy.    Pay attention to when your baby is most active. You may notice your baby's sleep and wake cycles. You may also notice things that make your baby move more. You should do a fetal movement count:   When your baby is normally most active.     At the same time each day.      A good time to count movements is while you are resting, after having something to eat and drink.      How do I count fetal movements?    1. Find a quiet, comfortable area. Sit, or lie down on your side.    2. Write down the date, the start time and stop time, and the number of movements that you felt between those two times. Take this information with you to your health care visits.    3. Write down your start time when you feel the first movement.    4. Count kicks, flutters, swishes, rolls, and jabs. You should feel at least 10 movements.    5. You may stop counting after you have felt 10 movements, or if you have been counting for 2 hours. Write down the stop time.    6. If you do not feel 10 movements in 2 hours, contact your health care provider for further instructions. Your health care provider may want to do additional tests to assess your baby's well-being.      Contact a health care provider if:     You feel fewer than 10 movements in 2 hours.     Your baby is not moving like he or she usually does.    Date: ____________ Start time: ____________ Stop time: ____________  Movements: ____________    Date: ____________ Start time: ____________ Stop time: ____________ Movements: ____________    Date: ____________ Start time: ____________ Stop time: ____________ Movements: ____________    Date: ____________ Start time: ____________ Stop time: ____________ Movements: ____________    Date: ____________ Start time: ____________ Stop time: ____________ Movements: ____________    Date: ____________ Start time: ____________ Stop time: ____________ Movements: ____________    Date: ____________ Start time: ____________ Stop time: ____________ Movements: ____________    Date: ____________ Start time: ____________ Stop time: ____________ Movements: ____________    Date: ____________ Start time: ____________ Stop time: ____________ Movements: ____________      This information is not intended to replace advice given to you by your health care provider. Make sure you discuss any questions you have with your health care provider.      Document Revised: 09/30/2018 Document Reviewed: 09/30/2018  Elsevier Patient Education ? 2021 Elsevier Inc.

## 2020-05-23 NOTE — ED Notes (Signed)
ED Triage Note       OBED Triage Entered On:  05/23/2020 18:50 EDT    Performed On:  05/23/2020 18:38 EDT by Earlene Plater, RN, Tiffany E               OBED Triage   Barriers to Learning :   None evident   Primary Language :   English   Chief Complaint :   vomiting/can't keep anything down   Mode of Arrival :   Ambulatory   Infectious Disease Documentation :   Document assessment   OBED Reason for Visit :   Dehydration   Patient received chemo or biotherapy last 48 hrs? :   No   Dosing Weight Obtained By :   Measured   Weight Dosing :   80.73 kg(Converted to: 178 lb 0 oz)    Height :   167.6 cm(Converted to: 5 ft 6 in)    Body Mass Index Dosing :   29 kg/m2   Numeric Rating Pain Scale :   0 = No pain   Earlene Plater, RN, Tiffany E - 05/23/2020 18:38 EDT   DCP GENERIC CODE   Tracking Acuity :   3   Tracking Group :   ED OB [employer], RN, Louanne Belton - 05/23/2020 18:38 EDT   ED Allergies Section :   Document assessment   ED Home Meds Section :   Document assessment   Subjective Document Assessment :   Document assessment   OB History Document Assessment :   Document assessment   OB Ante Risk Factors Doc Assess :   Document assessment   OB Prenatal HX Doc Assess :   Document assessment   OB PMH PSH Document Assessment :   Document assessment   ED General Section :   Document assessment   ED History Section :   Document assessment   Immunization Document Assessment :   Document assessment   ED Advance Directives Section :   Document assessment   ED Bloodless Medicine :   Document assessment   ED Valuables / Belongings Sections :   Document assessment   ED Assessment Section :   Document assessment   Earlene Plater RN, Louanne Belton - 05/23/2020 18:38 EDT   ID Risk Screen Symptoms   Recent Travel History :   No recent travel   Close Contact with COVID-19 ID :   No   Last 14 days COVID-19 ID :   No   TB Symptom Screen :   No symptoms   C. diff Symptom/History ID :   Neither of the above   Patient Pregnant :   None of the above    MRSA/VRE Screening :   None of these apply   CRE Screening :   No   Earlene Plater RN, Tiffany E - 05/23/2020 18:38 EDT   Allergies   (As Of: 05/23/2020 18:50:24 EDT)   Allergies (Active)   No Known Allergies  Estimated Onset Date:   Unspecified ; Created ByEarlene Plater, RN, Tiffany E; Reaction Status:   Active ; Category:   Drug ; Substance:   No Known Allergies ; Type:   Allergy ; Updated By:   Earlene Plater, RN, Louanne Belton; Reviewed Date:   05/23/2020 18:41 EDT        ED Home Med List   Medication List   (As Of: 05/23/2020 18:50:24 EDT)   Home Meds    levothyroxine  :   levothyroxine ;  Status:   Documented ; Ordered As Mnemonic:   levothyroxine ; Simple Display Line:   0.075 mg, Oral, Daily, 0 Refill(s) ; Catalog Code:   levothyroxine ; Order Dt/Tm:   05/23/2020 18:41:19 EDT          multivitamin, prenatal  :   multivitamin, prenatal ; Status:   Documented ; Ordered As Mnemonic:   Prenatal 1 ; Simple Display Line:   1 tabs, Oral, Daily, 0 Refill(s) ; Catalog Code:   multivitamin, prenatal ; Order Dt/Tm:   05/23/2020 18:41:39 EDT            Subjective Data   Chief Complaint/Patient Verbalization of Reason for Admission :   vominting/cramping   Reason For Visit OB :   Abdominal pain, Nausea/Vomiting   Indications for Induction :   Present   Last Fetal Movement Date/Time :   05/23/2020 7:00 EDT   Contractions :   No   Urge to Push :   No   Bleeding :   No   Leaking Fluid :   No   Intercourse last 24 hours :   No   Earlene Plater RN, Louanne Belton - 05/23/2020 18:38 EDT   Obstetrical History   Pregnancy History   (As Of: 05/23/2020 18:50:24 EDT)   Rhett Bannister - 2, Para Fullterm - 1, Para Preterm - , Abortions - , Para Living - 1      Delivery/Outcome Date: 05/21/2017   Gestation Age At Birth:   70 Weeks 4 Days ; Full Gestation ;  Delivery Method:   Vaginal ; Gender:   Female ; Birth Weight:   7lb 7oz / 3374g ; Anesthesia:   Epidural ; Maternal Complications:   Fever greater than 100.4 ; Neonate Outcome:   Live Birth ; Neonate Complications:   Fever, Neonatal             Antepartum Risk Factors   Antepartum Risk Factors :   None   Reg PC Prior Uterine Surgery vE :   None   Earlene Plater RN, Louanne Belton - 05/23/2020 18:38 EDT   Prenatal Info   Date of First Prenatal Visit :   11/02/2019 EDT   Primary OB Provider :   Dr. Mendel Ryder   Prenatal Records Available :   Yes   Date of Last Prenatal Care Visit :   05/22/2020 EDT   Blood Type, External :   O positive   Antibody Screen, External :   Negative   Rubella, External :   Immune   RPR, External :   Negative   Hepatitis B, External :   Negative   Transcribed, Hepatitis C :   Negative   HIV Antibodies, External :   Negative   Gonorrhea, External :   Negative   Chlamydia, External :   Negative   Herpes Simplex Virus, External :   Unknown   Tdap Vaccine, External :   Yes   Tdap Vaccine Date Performed :   03/27/2020 EST   Toxicology Screen, External :   No   Group B Strep, External :   Positive   Rhogam, External :   N/A   Antepartum Steroids, External :   None   Earlene Plater, RN, Louanne Belton - 05/23/2020 18:38 EDT   Past Medical History   Past Medical History   (As Of: 05/23/2020 18:50:24 EDT)   Pregnant  Name of Problem:   Pregnant ; Onset Date:   08/17/2016 ; Recorder:   Earlene Plater, Charity fundraiser, [address]; Confirmation:   Confirmed ; Classification:   Medical ; Code:   939030092 ; Last Updated:   05/23/2020 18:44 EDT ; Life Cycle Date:   Unknown 05/21/2017 ; Life Cycle Status:   Resolved ; Vocabulary:   SNOMED CT ; Resolved Date:   Unknown 05/21/2017 ; Resolved Age:   27 years            (As Of: 05/23/2020 18:50:24 EDT)   Problems(Active)    Pregnant (SNOMED CT  :330076226 )  Name of Problem:   Pregnant ; Onset Date:   09/02/2019 ; Recorder:   Earlene Plater, RN, Tiffany E; Confirmation:   Confirmed ; Classification:   Medical ; Code:   [id number] ; Last Updated:   05/23/2020 18:38 EDT ; Life Cycle Status:   Active ; Responsible Provider:   Earlene Plater, RN, Louanne Belton; Vocabulary:   SNOMED CT          Harm Screen   Suspect or Concern for: :   None   Feels Safe Where Live :   Yes   Last 3 mo,  thoughts killing self/others :   Patient denies   Cognitively Impaired :   No   Ambulatory or Self Mobile in [facility] :   Yes   Earlene Plater, RN, Louanne Belton - 05/23/2020 18:38 EDT   Social History   Social History   (As Of: 05/23/2020 18:50:24 EDT)   Tobacco:        Tobacco use: Former smoker, quit more than 30 days ago.   (Last Updated: 05/23/2020 18:49:32 EDT by Earlene Plater, RN, Tiffany E)          Electronic Cigarette/Vaping:        Former use, quit more than 90 days ago Electronic Cigarette Use.   (Last Updated: 05/23/2020 18:49:32 EDT by Earlene Plater, RN, Tiffany E)          Alcohol:        Denies   (Last Updated: 05/23/2020 18:49:32 EDT by Earlene Plater, RN, Tiffany E)          Substance Use:        Opioid Naive - not currently taking opioids, Denies   (Last Updated: 05/23/2020 18:49:32 EDT by Earlene Plater, RN, Tiffany E)            Immunizations   Influenza Vaccine Status :   Patient refused   Dani Gobble - 05/23/2020 18:38 EDT   Advance Directive   Advance Directive :   No   Patient Wishes to Receive Further Information on Advance Directives :   No   Dani Gobble - 05/23/2020 18:38 EDT   Bloodless Medicine   Is Blood Transfusion Acceptable to Patient :   Yes   Earlene Plater, RN, Tiffany E - 05/23/2020 18:38 EDT   Valuables and Belongings   Does Patient Have Valuables and Belongings :   Yes   Earlene Plater, RN, Louanne Belton - 05/23/2020 18:38 EDT   Valuables and Belongings   At Bedside :   Clothes, Jewelry, Purse/Wallet, Cell phone   Earlene Plater RN, Tiffany E - 05/23/2020 18:38 EDT   Patient Search Completed :   Harriett Rush, RN, Tiffany E - 05/23/2020 18:38 EDT   Assessment   Level of Consciousness :   Alert   Orientation :   Oriented x 4   Glasgow Coma Scale Adult Document Assessment :   Document adult assessment   Affect/Behavior :   Appropriate   Skin Color :   Normal for  ethnicity   Skin Description :   Dry   Skin Temperature :   Warm   Dani GobbleDavis, RN, Tiffany E - 05/23/2020 18:38 EDT

## 2020-05-24 LAB — COMPREHENSIVE METABOLIC PANEL
ALT: 30 U/L (ref 0–35)
AST: 41 U/L — ABNORMAL HIGH (ref 0–35)
Albumin/Globulin Ratio: 1 mmol/L (ref 1.00–2.70)
Albumin: 3.3 g/dL — ABNORMAL LOW (ref 3.5–5.2)
Alk Phosphatase: 173 U/L — ABNORMAL HIGH (ref 35–117)
Anion Gap: 15 mmol/L (ref 2–17)
BUN: 7 mg/dL (ref 6–20)
CALCIUM,CORRECTED,CCA: 8.8 mg/dL (ref 8.6–10.0)
CO2: 16 mmol/L — ABNORMAL LOW (ref 22–29)
Calcium: 8.2 mg/dL — ABNORMAL LOW (ref 8.6–10.0)
Chloride: 104 mmol/L (ref 98–107)
Creatinine: 0.4 mg/dL — ABNORMAL LOW (ref 0.5–1.0)
GFR African American: 163 mL/min/{1.73_m2} (ref >=90)
GFR Non-African American: 141 mL/min/{1.73_m2} (ref >=90)
Globulin: 2 g/dL (ref 1.9–4.4)
Glucose: 78 mg/dL (ref 70–99)
OSMOLALITY CALCULATED: 267 mOsm/kg — ABNORMAL LOW (ref 270–287)
Potassium: 3 mmol/L — CL (ref 3.5–5.3)
Sodium: 135 mmol/L (ref 135–145)
Total Bilirubin: 0.28 mg/dL (ref 0.00–1.20)
Total Protein: 5.7 g/dL — ABNORMAL LOW (ref 6.4–8.3)

## 2020-05-27 LAB — CULTURE, URINE: FINAL REPORT: NO GROWTH

## 2020-06-03 LAB — ANTIBODY SCREEN: Antibody Screen: NEGATIVE

## 2020-06-03 LAB — CBC
Hematocrit: 37.2 % (ref 34.0–47.0)
Hemoglobin: 12.8 g/dL (ref 11.5–15.7)
MCH: 31.4 pg (ref 27.0–34.5)
MCHC: 34.4 g/dL (ref 32.0–36.0)
MCV: 91.2 fL (ref 81.0–99.0)
MPV: 9 fL (ref 7.2–13.2)
Platelets: 363 10*3/uL (ref 140–440)
RBC: 4.08 x10e6/mcL (ref 3.60–5.20)
RDW: 12.6 % (ref 11.0–16.0)
WBC: 8.2 10*3/uL (ref 3.8–10.6)

## 2020-06-03 LAB — ABO/RH: ABO/Rh: O POS

## 2020-06-03 NOTE — Anesthesia Pre-Procedure Evaluation (Signed)
Preanesthesia Evaluation TP        Patient:   [name] [name], [name] [name]             MRN: [id number]            FIN: [id number]               Age:   30 years     Sex:  Female     DOB:  10-25-1990   Associated Diagnoses:   None   Author:   Golden Circle C-MD      Preoperative Information   NPO:  NPO greater than 8 hours.    Anesthesia history     Patient's history: negative.     Family's history: negative.        Health Status   Allergies:    Allergic Reactions (Selected)  No Known Allergies,    Allergies    (Active and Proposed Allergies Only)  No Known Allergies   (Severity: Unknown severity, Onset: Unknown)     Current medications:    Home Medications (2) Active  levothyroxine 0.075 mg, Oral, Daily  Prenatal 1 1 tabs, Oral, Daily  ,    Medications (34) Active  Scheduled: (10)  acetaminophen 325 mg Tab  650 mg 2 tabs, Oral, On Call  AMPICILLIN 1GM/100ML NS ADM  1 g 100 mL, IV Piggyback, q4hr-INT  AMPICILLIN 2 GM/100 ML NS ADM  2 g 100 mL, IV Piggyback, Once  atropine-diphenoxylate 0.025 mg-2.5 mg Tab  2 tabs, Oral, On Call  atropine-diphenoxylate 0.025 mg-2.5 mg Tab  1 tabs, Oral, On Call  azithromycin 500mg /290ml NS ADM  500 mg 250 mL, IV Piggyback, On Call  ceFAZolin duplex  2 g 50 mL, IV Piggyback, On Call  oxytocin 30 units + premix sodium chloride 0.9%..... 500 mL  500 mL, IV Bolus  promethazine 25 mg/mL Inj Soln 1 mL  50 mg 2 mL, IM, On Call  Sodium Chloride 0.9% intravenous solution 1,000 mL  1,000 mL, IV  Continuous: (5)  bupivacaine-fentaNYL 0.125%-2 mcg/mL-NaCl 0.9% 100 mL  100 mL, Epidural, 12 mL/hr  Dextrose 5% with 0.9% NaCl intravenous solution 1,000 mL  1,000 mL, IV, 125 mL/hr  oxytocin 30 units + premix sodium chloride 0.9%..... 500 mL  500 mL, IV  oxytocin 30 units + premix sodium chloride 0.9%..... 500 mL  500 mL, IV  Sodium Chloride 0.9% intravenous solution 1,000 mL  1,000 mL, IV Bolus, 999 mL/hr  PRN: (19)  acetaminophen 325 mg Tab  650 mg 2 tabs, Oral, q4hr  acetaminophen 500 mg Tab  1,000 mg 2 tabs, Oral,  q6hr  acetaminophen 500 mg Tab  1,000 mg 2 tabs, Oral, On Call  ammonia aromatic Solution  1 EA, Inhale, On Call  carboprost 250 mcg/mL Inj Soln 1 mL  250 mcg 1 mL, IM, On Call  ePHEDrine 50 mg/mL Inj Soln 1 mL  5 mg 0.1 mL, IV Push, q31min  lidocaine 1% Inj Soln 50 mL  50 mL, IM, On Call  methylergonovine 0.2 mg Tab  0.2 mg 1 tabs, Oral, q4hr  methylergonovine 0.2 mg/mL Inj Soln 1 mL  0.2 mg 1 mL, IM, On Call  mineral oil Oral Liquid 30 mL  30 mL, Topical, On Call  miSOPROStol  200 mcg Tab  800 mcg 4 tabs, PR, On Call  nalbuphine 10 mg/mL Inj Soln 1 mL  5 mg 0.5 mL, IV Push, q3hr  NIFEdipine 10 mg Cap  10 mg 1 caps, Oral, On Call  ondansetron 2 mg/mL Inj Soln 2 mL  4 mg 2 mL, IV Push, q6hr  oxytocin 10 units/mL Inj Soln 1 mL  10 units 1 mL, IM, On Call  phenylephrine  100 mcg, IV Push, q92min  silver nitrate Stick  1 app, Topical, On Call  Sodium Chloride 0.9% intravenous solution Bolus  500 mL, IV Bolus, On Call  tranexamic acid 1000mg / 100 mL NS ADM + Premix ADM sodium chloride inj... Marland KitchenMarland Kitchen 100 mL  1,000 mg 100 mL, IV Piggyback, On Call     Problem list:    Active Problems (2)  Group B streptococcus   Pregnant   ,    Problems   (Active Problems Only)    Pregnant   (SNOMED CT: Marland Kitchen, Onset: 09/02/19)  Group B streptococcus   (SNOMED CT: 11/03/19, Onset: --)        Histories   Past Medical History:    Resolved  Pregnant ([id number]): Onset on 08/17/2016 at 26 years.  Resolved on 05/21/2017 at 27 years.   Procedure history:    No active procedure history items have been selected or recorded.   Social History        Social & Psychosocial Habits    Alcohol  05/23/2020  Use: Denies    Substance Use  05/23/2020  Opioid Assessment Opioid Naive-not taking    Use: Denies    Tobacco  05/23/2020  Use: Former smoker, quit more    Electronic Cigarette/Vaping  05/23/2020  Electronic Cigarette Use: Former use, quit more tha  .     Symptoms of Sleep Apnea Score.   Physical Examination   Vital Signs   06/03/2020 8:07 EDT Systolic  Blood Pressure 110 mmHg    Diastolic Blood Pressure 73 mmHg    Temperature Oral 36.7 degC    Heart Rate Monitored 101 bpm  HI    Mean Arterial Pressure, Cuff 87 mmHg         Vital Signs (last 24 hrs)_____  Last Charted___________  Temp Oral     36.7 degC  (APR 10 08:07)  SBP      110 mmHg  (APR 10 08:07)  DBP      73 mmHg  (APR 10 08:07)  Height      167.6 cm  (APR 10 08:02)  BMI      29.87  (APR 10 08:07)     Measurements from flowsheet : Measurements   06/03/2020 8:07 EDT Body Mass Index est meas 29.87 kg/m2    Body Mass Index Measured 29.87 kg/m2   06/03/2020 8:02 EDT Height/Length Measured 167.6 cm    Weight Dosing 83.9 kg      Pain assessment:  Pain Assessment   06/03/2020 8:02 EDT Numeric Rating Pain Scale 5 = Moderate pain    Primary Pain Location Abdomen    Primary Pain Laterality Bilateral    Primary Pain Time Pattern Intermittent    Primary Pain Quality Cramping      .    General:       Appearance: Within normal limits.    Airway:          Mallampati classification: II (soft palate, fauces, uvula visible).         Thyromental Distance: Normal.         Mouth: Teeth ( Within normal limits ).         Throat: Within normal limits.    Neck:  Full range of motion.    Respiratory:  Lungs are clear to  auscultation, Breath sounds are equal.    Cardiovascular:  Normal rate, Regular rhythm.       Review / Management   Results review:     Labs (Last four charted values)  WBC                  8.2 (APR 10)   Hgb                  12.8 (APR 10)   Hct                  37.2 (APR 10)   Plt                  363 (APR 10) , Lab results   06/03/2020 8:58 EDT WBC 8.2 x10e3/mcL    RBC 4.08 x10e6/mcL    Hgb 12.8 g/dL    Hct 16.1 %    MCV 09.6 fL    MCH 31.4 pg    MCHC 34.4 g/dL    RDW 04.5 %    Platelet 363 x10e3/mcL    MPV 9.0 fL   06/03/2020 8:07 EDT Estimated Creatinine Clearance 226.43 mL/min   .       Assessment and Plan   American Society of Anesthesiologists#(ASA) physical status classification:  Class II, E.    Anesthetic  Preoperative Plan     Anesthetic technique: Neuraxial.     Regional: Epidural.     Risks discussed: nausea, vomiting, headache, hypotension, allergic reaction, serious complications.     Signature Line     Electronically Signed on 06/03/2020 09:17 AM EDT   ________________________________________________   Golden Circle C-MD

## 2020-06-03 NOTE — ED Notes (Signed)
ED Triage Note       OBED Triage Entered On:  06/03/2020 8:07 EDT    Performed On:  06/03/2020 8:02 EDT by Hali Marry S-RN               OBED Triage   Barriers to Learning :   None evident   Primary Language :   English   Chief Complaint :   Nausea/Vomiting/Diarrhea   Mode of Arrival :   Ambulatory   Infectious Disease Documentation :   Document assessment   OBED Reason for Visit :   Nausea   Patient received chemo or biotherapy last 48 hrs? :   No   Dosing Weight Obtained By :   Patient stated   Weight Dosing :   83.9 kg(Converted to: 184 lb 15 oz)    Height :   167.6 cm(Converted to: 5 ft 6 in)    Body Mass Index Dosing :   30 kg/m2   Numeric Rating Pain Scale :   5 = Moderate pain   ED Pain Details :   Pain Details   Hali Marry S-RN - 06/03/2020 8:02 EDT   DCP GENERIC CODE   Tracking Acuity :   4   Tracking Group :   [facility]   Hali Marry S-RN - 06/03/2020 8:02 EDT   ED Allergies Section :   Document assessment   ED Home Meds Section :   Document assessment   Subjective Document Assessment :   Document assessment   OB History Document Assessment :   Document assessment   OB Ante Risk Factors Doc Assess :   Document assessment   OB Prenatal HX Doc Assess :   Document assessment   OB PMH PSH Document Assessment :   Document assessment   ED General Section :   Document assessment   ED History Section :   Document assessment   Immunization Document Assessment :   Document assessment   ED Advance Directives Section :   Document assessment   ED Bloodless Medicine :   Document assessment   ED Valuables / Belongings Sections :   Document assessment   ED Assessment Section :   Document assessment   Hali Marry S-RN - 06/03/2020 8:02 EDT   ID Risk Screen Symptoms   Recent Travel History :   No recent travel   Close Contact with COVID-19 ID :   No   Last 14 days COVID-19 ID :   No   TB Symptom Screen :   No symptoms   C. diff Symptom/History ID :   Neither of the above   Patient Pregnant :   None of  the above   MRSA/VRE Screening :   None of these apply   CRE Screening :   No   Hali Marry S-RN - 06/03/2020 8:02 EDT   Pain Assessment   Laterality :   Bilateral   Time Pattern :   Intermittent   Pain Location :   Abdomen   Quality :   Cramping   Hali Marry S-RN - 06/03/2020 8:02 EDT   Image 4 -  Images currently included in the form version of this document have not been included in the text rendition version of the form.   Allergies   (As Of: 06/03/2020 08:07:38 EDT)   Allergies (Active)   No Known Allergies  Estimated Onset Date:   Unspecified ; Created  ByEarlene Plater, RN, Tiffany E; Reaction Status:   Active ; Category:   Drug ; Substance:   No Known Allergies ; Type:   Allergy ; Updated By:   Earlene Plater, RN, Louanne Belton; Reviewed Date:   06/03/2020 8:04 EDT        ED Home Med List   Medication List   (As Of: 06/03/2020 08:07:38 EDT)   Home Meds    levothyroxine  :   levothyroxine ; Status:   Documented ; Ordered As Mnemonic:   levothyroxine ; Simple Display Line:   0.075 mg, Oral, Daily, 0 Refill(s) ; Catalog Code:   levothyroxine ; Order Dt/Tm:   05/23/2020 18:41:19 EDT          multivitamin, prenatal  :   multivitamin, prenatal ; Status:   Documented ; Ordered As Mnemonic:   Prenatal 1 ; Simple Display Line:   1 tabs, Oral, Daily, 0 Refill(s) ; Catalog Code:   multivitamin, prenatal ; Order Dt/Tm:   05/23/2020 18:41:39 EDT            Subjective Data   Chief Complaint/Patient Verbalization of Reason for Admission :   Nausea/Vomiting/Diarrhea   Reason For Visit OB :   Nausea/Vomiting   Indications for Induction :   Present   Contractions :   Yes   Contraction Onset Date/Time :   06/03/2020 5:00 EDT   Contraction Frequency (min) :   q60min   Urge to Push :   No   Bleeding :   No   Leaking Fluid :   No   Intercourse last 24 hours :   No   Hali Marry S-RN - 06/03/2020 8:02 EDT   Obstetrical History   Pregnancy History   (As Of: 06/03/2020 08:07:38 EDT)   Rhett Bannister - 2, Para Fullterm - 1, Para Preterm - , Abortions - , Para Living  - 1      Delivery/Outcome Date: 05/21/2017   Gestation Age At Birth:   38 Weeks 4 Days ; Full Gestation ;  Delivery Method:   Vaginal ; Gender:   Female ; Birth Weight:   7lb 7oz / 3374g ; Anesthesia:   Epidural ; Maternal Complications:   Fever greater than 100.4 ; Neonate Outcome:   Live Birth ; Neonate Complications:   Fever, Neonatal            Antepartum Risk Factors   Antepartum Risk Factors :   None   Reg PC Prior Uterine Surgery vE :   None   Hali Marry S-RN - 06/03/2020 8:02 EDT   Prenatal Info   Date of First Prenatal Visit :   11/02/2019 EDT   Primary OB Provider :   Dr. Mendel Ryder   Prenatal Records Available :   Yes   Prenatal Care :   4 visits or more   Blood Type, External :   O positive   Antibody Screen, External :   Negative   Rubella, External :   Immune   RPR, External :   Negative   Hepatitis B, External :   Negative   Transcribed, Hepatitis C :   Negative   HIV Antibodies, External :   Negative   Gonorrhea, External :   Negative   Chlamydia, External :   Negative   Herpes Simplex Virus, External :   Unknown   Tdap Vaccine, External :   Yes   Tdap Vaccine Date Performed :   03/27/2020 EST  Toxicology Screen, External :   No   Group B Strep, External :   Positive   Rhogam, External :   N/A   Antepartum Steroids, External :   None   Hali Marry S-RN - 06/03/2020 8:02 EDT   Past Medical History   Past Medical History   (As Of: 06/03/2020 08:07:38 EDT)   Pregnant  Name of Problem:   Pregnant ; Onset Date:   08/17/2016 ; Recorder:   Earlene Plater, RN, Tiffany E; Confirmation:   Confirmed ; Classification:   Medical ; Code:   469629528 ; Last Updated:   05/23/2020 18:44 EDT ; Life Cycle Date:   Unknown 05/21/2017 ; Life Cycle Status:   Resolved ; Vocabulary:   SNOMED CT ; Resolved Date:   Unknown 05/21/2017 ; Resolved Age:   30 years            (As Of: 06/03/2020 08:07:38 EDT)   Problems(Active)    Group B streptococcus (SNOMED CT  :413244010 )  Name of Problem:   Group B streptococcus ; Recorder:   SYSTEM,  SYSTEM;  Classification:   Medical ; Code:   272536644 ; Last Updated:   05/23/2020 18:50 EDT ; Life Cycle Date:   05/23/2020 ; Life Cycle Status:   Active ; Vocabulary:   SNOMED CT        Pregnant (SNOMED CT  :034742595 )  Name of Problem:   Pregnant ; Onset Date:   09/02/2019 ; Recorder:   Earlene Plater, RN, Tiffany E; Confirmation:   Confirmed ; Classification:   Medical ; Code:   [id number] ; Last Updated:   05/23/2020 18:38 EDT ; Life Cycle Status:   Active ; Responsible Provider:   Earlene Plater, RN, Louanne Belton; Vocabulary:   SNOMED CT          Harm Screen   Feels Safe Where Live :   Yes   Agency(s)/Others notified :   No   Last 3 mo, thoughts killing self/others :   Patient denies   Cognitively Impaired :   No   Ambulatory or Self Mobile in [facility] :   Yes   Hali Marry S-RN - 06/03/2020 8:02 EDT   Social History   Social History   (As Of: 06/03/2020 08:07:38 EDT)   Tobacco:        Tobacco use: Former smoker, quit more than 30 days ago.   (Last Updated: 05/23/2020 18:49:32 EDT by Earlene Plater, RN, Tiffany E)          Electronic Cigarette/Vaping:        Former use, quit more than 90 days ago Electronic Cigarette Use.   (Last Updated: 05/23/2020 18:49:32 EDT by Earlene Plater, RN, Tiffany E)          Alcohol:        Denies   (Last Updated: 05/23/2020 18:49:32 EDT by Earlene Plater, RN, Tiffany E)          Substance Use:        Opioid Naive - not currently taking opioids, Denies   (Last Updated: 05/23/2020 18:49:32 EDT by Earlene Plater, RN, Tiffany E)            Immunizations   Influenza Vaccine Status :   Patient refused   Hali Marry S-RN - 06/03/2020 8:02 EDT   Bloodless Medicine   Is Blood Transfusion Acceptable to Patient :   Yes   Hali Marry S-RN - 06/03/2020 8:02 EDT   Valuables and Belongings   Does Patient Have Valuables  and Belongings :   Yes   Hali MarryBest,  Emily S-RN - 06/03/2020 8:02 EDT   Valuables and Belongings   At Bedside :   Clothes, Cell phone   Hali MarryBest,  Emily S-RN - 06/03/2020 8:02 EDT   Patient Search Completed :   Kreg ShropshireNA   Best,  Emily S-RN - 06/03/2020 8:02 EDT   Assessment    Orientation :   Oriented x 4   Affect/Behavior :   Appropriate   Skin Color :   Normal for ethnicity   Skin Description :   Dry   Skin Temperature :   Warm   Hali MarryBest,  Emily S-RN - 06/03/2020 8:02 EDT

## 2020-06-04 NOTE — Progress Notes (Signed)
Inpt Note        Patient:   [name] [name], [name] [name]             MRN: [id number]            FIN: [id number]               Age:   30 years     Sex:  Female     DOB:  Jan 01, 1991   Associated Diagnoses:   None   Author:   Golden Circle C-MD      Pt seen, happy with their anesthesia, no questions or concerns.  Available as needed.   Signature Line     Electronically Signed on 06/04/2020 06:17 AM EDT   ________________________________________________   Golden Circle C-MD

## 2020-06-05 NOTE — Case Communication (Signed)
Discharge Follow-Up Form - Text       Discharge Follow-Up Entered On:  06/06/2020 13:39 EDT    Performed On:  06/06/2020 13:39 EDT by Coralie Common RN, KELLY A               Clinical   Provider Follow-Up Post Discharge RTF :   Follow-Up Appointments    With:  Leane Platt A-MD  Address:  [address], [place], [place], [address];[phone]  When:  In 6 weeks       SOUTHGATE, RN, KELLY A - 06/06/2020 13:39 EDT   Status   Case Status :   Incomplete   Phone Call History Post DC (Readmit) :   First call   TCM Call Outcome :   Message left   SOUTH[place]ATE, RN, KELLY A - 06/06/2020 13:39 EDT

## 2020-06-05 NOTE — Case Communication (Signed)
Discharge Follow-Up Form - Text       Discharge Follow-Up Entered On:  06/09/2020 14:15 EDT    Performed On:  06/09/2020 14:15 EDT by Coralie Common RN, KELLY A               Clinical   Provider Follow-Up Post Discharge RTF :   Follow-Up Appointments    With:  Leane Platt A-MD  Address:  [address], [place], [place], [address];[phone]  When:  In 6 weeks       SOUTHGATE, RN, KELLY A - 06/09/2020 14:15 EDT   Status   Case Status :   Completed   Reason for Removal :   Attempt Limit   SOUTHGATE, RN, KELLY A - 06/09/2020 14:15 EDT

## 2020-06-05 NOTE — Case Communication (Signed)
Discharge Follow-Up Form - Text       Discharge Follow-Up Entered On:  06/08/2020 12:38 EDT    Performed On:  06/08/2020 12:38 EDT by Coralie Common RN, KELLY A               Clinical   Provider Follow-Up Post Discharge RTF :   Follow-Up Appointments    With:  Leane Platt A-MD  Address:  [address] [place], [place], [address];[phone]  When:  In 6 weeks       S[place], RN, KELLY A - 06/08/2020 12:38 EDT   Status   Case Status :   Incomplete   Phone Call History Post DC (Readmit) :   Second call   TCM Call Outcome :   No answer/unable to reach   Coralie Common, RN, KELLY A - 06/08/2020 12:38 EDT

## 2020-06-05 NOTE — Progress Notes (Signed)
 Pregnancy Summary Document     [name] [name], [name] [name]  DOB:  1990/04/09 Age:  30 Years MRN:  [id number]  --------------------------------------------------------------------------------------------------------------------------------------------   [name] [name], [name] [name]  DOB: 06-11-1990  Pregnancy Summary    (Date of Report: 06/05/20)    G 2 P 1 (1,0,0,1)  Gestation: Gerri LMP: 08/22/2019  (from pt. history)  EDD: 06/08/2020 EDD/EGA Method: Last Menstrual Period EGA: Delivered                Gestation info at delivery:    A : 39 weeks 2 days  --------------------------------------------------------------------------------------------------------------------------------------------   [name] [name], [name] [name]  DOB: 04-Oct-1990  Antepartum Note     No antepartum notes have been documented    --------------------------------------------------------------------------------------------------------------------------------------------   [name] [name]  DOB: 07-16-1990  Problems   (Active Problems Only)  Pregnant   (SNOMED CT: 808926986, Onset: 09/02/19)  Group B streptococcus   (SNOMED CT: 506381983, Onset: --)    --------------------------------------------------------------------------------------------------------------------------------------------   [name] [name], [name] [name]  DOB: Oct 29, 1990  Risk Factors and Genetic Screening     All Results Documented Since 09/02/2019  Risk Factors (Current Pregnancy)  Unique Risk Factors:  None    Ethnic Screening        No ethnicities have been recorded.    Genetic Disorders Screening        No genetic disorders have been recorded.    --------------------------------------------------------------------------------------------------------------------------------------------   [name] [name], [name] [name]  DOB: 1990/03/19  Gestational Age (EGA) and EDD     * Note: EGA calculated as of 06/05/2020    EDD: 06/08/2020 EGA*: 39 weeks 2 days            Type: Authoritative Method Date: 09/02/2019          Method: Last Menstrual Period (09/02/2019)         Confirmation: Confirmed        Description: --        Comments: --        Entered byBETHA Moats, RN, Tiffany E on 05/23/2020     Other EDD Calculations for this Pregnancy:        No additional EDD calculations have been recorded for this pregnancy    --------------------------------------------------------------------------------------------------------------------------------------------   [name] [name]  DOB: October 10, 1990  Measurements  Pre-Pregnancy Weight: --      Recent Weight Measured: --       Height/Length Measured: 167.6 cm    06/03/20 (39 weeks)  Body Mass Index Measured: 29.87 kg/m2    06/03/20 (39 weeks)    --------------------------------------------------------------------------------------------------------------------------------------------   [name] [name]  DOB: [birth year]  Prenatal Exam and Notes    Date EGA FunHt PTL S/S Cervix BP Weight Urine Baby     cm  Dil Eff(%) Sta mmHg lbs  kg Gluc Prot FHR Fetal Activity Fet Pres   06/03/20 [redacted]w[redacted]d --       --      10     100% -1  93/51 -- --         --         A       = 125bpm  -- --   05/23/20 [redacted]w[redacted]d --       --      2      50%  -2  120/72 -- --         --         A       = 125bpm  *Present pe... --       * Fetal Activity  05/23/2020 Present per patient    --------------------------------------------------------------------------------------------------------------------------------------------  [name] [name], [name] [name]  DOB: Nov 06, 1990  Physical Exams  Physical Exam   Mental Status    Level of Consciousness - APVU:  Alert (05/23/20)    Orientation Assessment:  Oriented x 4 (06/05/20)    Affect/Behavior:  Appropriate, Calm, Cooperative (06/05/20)    Appearance BH:  Appropriate (06/05/20)    Hallucinations Present:  None (06/05/20)   Cardiovascular    Cardiovascular Symptoms:  None (06/05/20)    Nail Bed Color:  Pink (06/05/20)    Capillary Refill:  Less than 2 seconds (06/05/20)    Clubbing Present:  No (06/05/20)    Jugular Venous Distention:  Unable to visualize  (06/05/20)    Heart Sounds ICU:  S1S2 (06/05/20)   Respiratory    Respiratory Symptoms:  None (06/05/20)    Shortness of Breath Indicator:  No shortness of breath (06/05/20)    Respirations:  Unlabored (06/05/20)    Respiratory Pattern:  Regular (06/05/20)    Chest Motion:  Symmetrical (06/05/20)    Oxygen Therapy:  Room air (06/05/20)    SpO2:  99 % (06/05/20)    Cough:  None (06/05/20)    All Lobes Breath Sounds:  Clear (06/05/20)   Gastrointestinal    GI Symptoms:  None (06/05/20)    Appetite:  Good (06/05/20)    Abdomen Description:  Nondistended, Rounded, Symmetric (06/05/20)    Abdomen Palpation:  Non-Tender, Soft (06/05/20)    Bowel Sounds All Quadrants:  Present (06/05/20)    Passing Flatus:  Yes (06/05/20)   Extremities    Clonus:  Not present (06/05/20)    Edema     Edema:  None (06/05/20)   Integumentary    Skin Color General:  Usual for ethnicity (06/05/20)    Skin Color:  Normal for ethnicity (06/03/20)    Skin Description:  Dry (06/03/20)    Skin Temperature:  Warm (06/05/20)    Skin Turgor General:  Elastic (06/05/20)    Skin Moisture General:  Dry (06/05/20)    Skin Integrity General:  Intact (06/05/20)    Mucous Membrane Color:  Pink (06/05/20)    Mucous Membrane Description:  Moist (06/05/20)    --------------------------------------------------------------------------------------------------------------------------------------------   [name] [name], [name] [name]  DOB: 24-Apr-1990  Blood Types and Anti-D Immune Globulin  Blood Type and Screen  Mother ABO/Rh:  --  Mother Antibody Screen:  --  Father of Baby ABO/Rh:  --  Father of Baby Antibody Screen:  --    Anti-D Immune Globulin  Rho(D) Initial Status:  --  Rho(D) 28 Week Status:  --  Rho(D) Dates Given:  --    --------------------------------------------------------------------------------------------------------------------------------------------   [name] [name], [name] [name]  DOB: July 30, 1990  Prenatal Tests and Lab Results  Results ending with 'TR' have been keyed in by the  practice or interpreted manually.  Result Date: Estimated weeks post pregnancy onset will display next to actual result date.      Test Result Date Ref Range    WBC  7.1 x10e3/mcL  05/23/20  (37 weeks)   (3.8 - 10.6)    Immature Grans Absolute  0.05 x10e3/mcL  05/23/20  (37 weeks)   (0.00 - 0.06)    Neutro Auto  (H) 81.2 %  05/23/20  (37 weeks)  (42.0 - 74.0)    Lymph Auto  (L) 12.7 %  05/23/20  (37 weeks)  (15.0 - 45.0)    Mono Auto  5.3 %  05/23/20  (37 weeks)   (4.0 - 12.0)    Basophil Auto  0.1 %  05/23/20  (37 weeks)   (  0.0 - 2.0)    RBC  3.93 x10e6/mcL  05/23/20  (37 weeks)   (3.60 - 5.20)    Immature Grans Percent  (H) 0.7 %  05/23/20  (37 weeks)  (0.0 - 0.6)    Eosinophil Percent  0.0 %  05/23/20  (37 weeks)   (0.0 - 7.0)    RDW  13.0 %  05/23/20  (37 weeks)   (11.0 - 16.0)    Eos Absolute  0.0 x10e3/mcL  05/23/20  (37 weeks)   (0.0 - 0.5)    Platelet  276 x10e3/mcL  05/23/20  (37 weeks)   (140 - 440)    MPV  8.8 fL  05/23/20  (37 weeks)   (7.2 - 13.2)    Hgb  12.2 g/dL  96/69/77  (37 weeks)   (11.5 - 15.7)    Neutro Absolute  5.8 x10e3/mcL  05/23/20  (37 weeks)   (1.6 - 7.3)    MCH  31.0 pg  05/23/20  (37 weeks)   (27.0 - 34.5)    Mono Absolute  0.4 x10e3/mcL  05/23/20  (37 weeks)   (0.3 - 1.0)    HCT  35.4 %  05/23/20  (37 weeks)   (34.0 - 47.0)    Lymph Absolute  (L) 0.9 x10e3/mcL  05/23/20  (37 weeks)  (1.0 - 3.2)    MCHC  34.5 g/dL  96/69/77  (37 weeks)   (32.0 - 36.0)    MCV  90.1 fL  05/23/20  (37 weeks)   (81.0 - 99.0)    Baso Absolute  0.0 x10e3/mcL  05/23/20  (37 weeks)   (0.0 - 0.2)    UA Blood  Negative  05/23/20  (37 weeks)   Negative    UA Color  Yellow  05/23/20  (37 weeks)       Bacteria U  (A) Few   05/23/20  (37 weeks)  Rare    UA Appear  (N) SL CLOUDY   05/23/20  (37 weeks)      UA pH  6.5  05/23/20  (37 weeks)   4.5 - 8.0    WBC U  (A) 21-50 /HPF  05/23/20  (37 weeks)  0-2    UA Glucose  Negative  05/23/20  (37 weeks)   Negative    UA Nitrite  Negative  05/23/20  (37 weeks)   Negative     Sq Epi U  (A) Few /LPF  05/23/20  (37 weeks)  Rare    UA Leuk Est  (A) Moderate   05/23/20  (37 weeks)  Negative    RBC U  0-2 /HPF  05/23/20  (37 weeks)   0-2    UA Ketones  (A) >=80 mg/dL  96/69/77  (37 weeks)  Negative    UA Bili  (A) Moderate   05/23/20  (37 weeks)  Negative    UA Urobilinogen  (N) 1.0 EU/dL  96/69/77  (37 weeks)      UA Spec Grav  1.025  05/23/20  (37 weeks)   1.003 - 1.035    Protein U  (A) 30   05/23/20  (37 weeks)  Negative    Mucus U  (A) Many /LPF  05/23/20  (37 weeks)  Not Seen    Creatinine Lvl  (L) 0.4 mg/dL  96/69/77  (37 weeks)  (0.5 - 1.0)    BUN  7 mg/dL  96/69/77  (37 weeks)   (6 - 20)    Glucose Random  78 mg/dL  96/69/77  (37 weeks)   (70 - 99)    Potassium Lvl  (C) 3.0 mmol/L  05/23/20  (37 weeks)  (3.5 - 5.3)    AST  (H) 41 unit/L  05/23/20  (37 weeks)  (0 - 35)    ALT  30 unit/L  05/23/20  (37 weeks)   (0 - 35)    Osmolality Calc  (L) 267 mOsm/kg  05/23/20  (37 weeks)  (270 - 287)    Sodium Lvl  135 mmol/L  05/23/20  (37 weeks)   (135 - 145)    Calcium Lvl  (L) 8.2 mg/dL  96/69/77  (37 weeks)  (8.6 - 10.0)    Albumin Lvl  (L) 3.3 g/dL  96/69/77  (37 weeks)  (3.5 - 5.2)    Protein Total  (L) 5.7 g/dL  96/69/77  (37 weeks)  (6.4 - 8.3)    Bili Total  0.28 mg/dL  96/69/77  (37 weeks)   (0.00 - 1.20)    Alk Phos  (H) 173 unit/L  05/23/20  (37 weeks)  (35 - 117)    Chloride  104 mmol/L  05/23/20  (37 weeks)   (98 - 107)    CO2  (L) 16 mmol/L  05/23/20  (37 weeks)  (22 - 29)    AGAP  15 mmol/L  05/23/20  (37 weeks)   (2 - 17)    eGFR Non-AA  141 mL/min/1.51m  05/23/20  (37 weeks)   >=90    eGFR AA  163 mL/min/1.30m  05/23/20  (37 weeks)   >=90    Globulin Calc  2.0 g/dL  96/69/77  (37 weeks)   (1.9 - 4.4)    AG Ratio Calc  1.00 mmol/L  05/23/20  (37 weeks)   (1.00 - 2.70)    Calcium (Corrected) Lvl  8.8 mg/dL  96/69/77  (37 weeks)   (8.6 - 10.0)    Estimated Creatinine Clearance  222.28 mL/min  05/23/20  (37 weeks)       Estimated Creatinine Clearance  226.43 mL/min   06/03/20  (39 weeks)       MCV  91.2 fL  06/03/20  (39 weeks)   (81.0 - 99.0)    MCHC  34.4 g/dL  95/89/77  (39 weeks)   (32.0 - 36.0)    RBC  4.08 x10e6/mcL  06/03/20  (39 weeks)   (3.60 - 5.20)    WBC  8.2 x10e3/mcL  06/03/20  (39 weeks)   (3.8 - 10.6)    HCT  37.2 %  06/03/20  (39 weeks)   (34.0 - 47.0)    RDW  12.6 %  06/03/20  (39 weeks)   (11.0 - 16.0)    MCH  31.4 pg  06/03/20  (39 weeks)   (27.0 - 34.5)    Hgb  12.8 g/dL  95/89/77  (39 weeks)   (11.5 - 15.7)    MPV  9.0 fL  06/03/20  (39 weeks)   (7.2 - 13.2)    Platelet  363 x10e3/mcL  06/03/20  (39 weeks)   (140 - 440)    EABORh Interp  (U) O POS   06/03/20  (39 weeks)      ECABS Interp  Negative  06/03/20  (39 weeks)          --------------------------------------------------------------------------------------------------------------------------------------------   [name] [name], [name] [name]  DOB: [birth year]  Actions      No Actions have been documented.    --------------------------------------------------------------------------------------------------------------------------------------------   [name] [name], [name] [name]  DOB: 28-May-1990  Situational Awareness      No comments have been documented.    --------------------------------------------------------------------------------------------------------------------------------------------   [name] [name], [name] [name]  DOB: [birth year]  Allergies    (Active and Proposed Allergies Only)  No Known Allergies   (Severity: Unknown severity, Onset: Unknown)    --------------------------------------------------------------------------------------------------------------------------------------------   [name] [name], [name] [name]  DOB: [birth year]  Medications  Prescriptions and Home Medications   Currently Active or Taking    Colace 100 mg oral capsule     SIG: 100 mg, 1 caps, Oral, BID, PRN: constipation, 60 caps, 1 Refill(s)     Ordered: 06/05/20 Provider: CLAUDENE LUKES A-MD    ibuprofen 800 mg oral tablet     SIG: 800 mg, 1 tabs, Oral, q8hr-INT, 60 tabs, 1  Refill(s)     Ordered: 06/05/20 Provider: CLAUDENE LUKES A-MD    levothyroxine (Historically recorded)     SIG: 0.075 mg, Oral, Daily, 0 Refill(s)     Ordered: 05/23/20 Provider: --    Prenatal 1 (Historically recorded)     SIG: 1 tabs, Oral, Daily, 0 Refill(s)     Ordered: 05/23/20 Provider: --     Inactive or Suspended   (Prescriptions and documented medications since 06/03/19)    No medications have been recorded    Medication Administrations In-Office/Hospital   (All in-office/hospital administrated medications ordered since 06/03/19)   ampicillin FlipTop vial (ampicillin)    UBC, Once    Status: Completed Ordered: 06/03/20 Provider: CONTRIBUTOR_SYSTEM,  PYXIS   ampicillin FlipTop vial (ampicillin)    UBC, Once    Status: Completed Ordered: 06/03/20 Provider: CONTRIBUTOR_SYSTEM,  PYXIS   ampicillin IVPB (AMPICILLIN 2 GM/100 ML NS ADM)    2 g, 100 mL, 200 mL/hr, IV Piggyback, Once    Status: Completed Ordered: 06/03/20 Provider: SANDIE IHA ANN-MD   oxytocin    UBC, Once    Status: Completed Ordered: 06/03/20 Provider: CONTRIBUTOR_SYSTEM,  PYXIS   Protonix IV Push (pantoprazole 40 mg IV Inj)    40 mg, IV Push, Once    Status: Completed Ordered: 05/23/20 Provider: PIETRO,  MELISSA-MD   sodium chloride 0.9% (sodium chloride flush)    UBC, Once    Status: Completed Ordered: 05/23/20 Provider: CONTRIBUTOR_SYSTEM,  PYXIS   Sodium Chloride 0.9% 1,000 mL (Sodium Chloride 0.9% intravenous solution 1,000 mL)    999 mL/hr, IV Bolus, Stop: 06/03/20 10:16:00 EDT    Status: Completed Ordered: 06/03/20 Provider: PERRI BIRMINGHAM C-MD   Sodium Chloride 0.9% 1,000 mL (Sodium Chloride 0.9% intravenous solution 1,000 mL)    999 mL/hr, IV Bolus, Stop: 05/23/20 20:04:00 EDT    Status: Completed Ordered: 05/23/20 Provider: PIETRO HARTMANN   Sodium Chloride 0.9% intravenous solution minibag plus (Sodium Chloride 0.9%)    UBC, Once    Status: Completed Ordered: 06/03/20 Provider: CONTRIBUTOR_SYSTEM,  PYXIS   Sodium Chloride  0.9% intravenous solution minibag plus (Sodium Chloride 0.9%)    UBC, Once    Status: Completed Ordered: 06/03/20 Provider: CONTRIBUTOR_SYSTEM,  PYXIS   acetaminophen (acetaminophen 500 mg Tab)    1,000 mg, 2 tabs, Oral, q6hr, PRN: mild pain (1-3)    Status: Discontinued Ordered: 06/03/20 Provider: SANDIE IHA ANN-MD   acetaminophen (acetaminophen 325 mg Tab)    650 mg, 2 tabs, Oral, q4hr, PRN: fever    Status: Discontinued Ordered: 06/03/20 Provider: SANDIE IHA ANN-MD   acetaminophen 500 mg oral tablet (acetaminophen 500 mg Tab)    1,000 mg, 2 tabs, Oral, On Call, PRN: other (see comment)  Status: Discontinued Ordered: 06/03/20 Provider: SANDIE IHA ANN-MD   ampicillin IVPB (AMPICILLIN 1GM/100ML NS ADM)    1 g, 100 mL, 200 mL/hr, IV Piggyback, q4hr-INT    Status: Discontinued Ordered: 06/03/20 Provider: SANDIE IHA ANN-MD   Ancef IVPB (ceFAZolin duplex)    2 g, 50 mL, 100 mL/hr, IV Piggyback, On Call    Status: Discontinued Ordered: 06/03/20 Provider: SANDIE IHA ANN-MD   azithromycin IVPB (azithromycin 500mg /249ml NS ADM)    500 mg, 250 mL, 250 mL/hr, IV Piggyback, On Call    Status: Discontinued Ordered: 06/03/20 Provider: SANDIE IHA ANN-MD   D5% in LR-KCl 20 mEq/L 1,000 mL (Dextrose 5% in Lactated Ringers with KCl 20 mEq/L intravenous solution 1,000 mL)    125 mL/hr, IV    Status: Discontinued Ordered: 05/23/20 Provider: PIETRO,  MELISSA-MD   D5NS 1,000 mL (Dextrose 5% with 0.9% NaCl intravenous solution 1,000 mL)    125 mL/hr, IV    Status: Discontinued Ordered: 05/23/20 Provider: PIETRO,  MELISSA-MD   Dextrose 5% with 0.9% NaCl 1,000 mL (Dextrose 5% with 0.9% NaCl intravenous solution 1,000 mL)    125 mL/hr, IV    Status: Discontinued Ordered: 06/03/20 Provider: CRUTE,  KIMBERLY ANN-MD   mineral oil (mineral oil Oral Liquid 30 mL)    30 mL, Topical, On Call, PRN: other (see comment)    Status: Discontinued Ordered: 06/03/20 Provider: SANDIE IHA ANN-MD   nalBUPHine  (nalbuphine 10 mg/mL Inj Soln 1 mL)    5 mg, 0.5 mL, IV Push, q3hr, PRN: moderate pain (4-7)    Status: Discontinued Ordered: 06/03/20 Provider: SANDIE IHA ANN-MD   NIFEdipine (NIFEdipine 10 mg Cap)    10 mg, 1 caps, Oral, On Call, PRN: hypertension severe    Status: Discontinued Ordered: 06/03/20 Provider: SANDIE IHA ANN-MD   NIFEdipine (NIFEdipine 10 mg Cap)    10 mg, 1 caps, Oral, On Call, PRN: hypertension severe    Status: Discontinued Ordered: 05/23/20 Provider: PIETRO,  MELISSA-MD   NS Bolus (Sodium Chloride 0.9% intravenous solution Bolus)    500 mL, 1500 mL/hr, IV Bolus, On Call, PRN: other (see comment)    Status: Discontinued Ordered: 06/03/20 Provider: SANDIE IHA ANN-MD   ondansetron injection (ondansetron 2 mg/mL Inj Soln 2 mL)    4 mg, 2 mL, IV Push, q6hr, PRN: nausea/vomiting    Status: Discontinued Ordered: 05/23/20 Provider: PIETRO HARTMANN   oxytocin IV additive 30 units + Sodium Chloride 0.9% 500 mL (oxytocin 30 units + premix sodium chloride 0.9%..... 500 mL)    TITRATE, IV    Status: Discontinued Ordered: 06/03/20 Provider: SANDIE IHA ANN-MD   Zofran (ondansetron 2 mg/mL Inj Soln 2 mL)    4 mg, 2 mL, IV Push, q6hr, PRN: nausea/vomiting    Status: Discontinued Ordered: 06/03/20 Provider: SANDIE IHA ANN-MD   acetaminophen (acetaminophen 325 mg Tab)    650 mg, 2 tabs, Oral, On Call    Status: Ordered Ordered: 06/03/20 Provider: SANDIE IHA ANN-MD   acetaminophen (acetaminophen 500 mg Tab)    1,000 mg, 2 tabs, Oral, q8hr-INT    Status: Ordered Ordered: 06/03/20 Provider: PIETRO HARTMANN   acetaminophen (acetaminophen 325 mg Tab)    650 mg, 2 tabs, Oral, q4hr, PRN: fever    Status: Ordered Ordered: 06/03/20 Provider: PIETRO,  MELISSA-MD   ammonia aromatic solution (ammonia aromatic Solution)    1 EA, Inhale, On Call, PRN: other (see comment)    Status: Ordered  Ordered: 06/03/20 Provider: SANDIE IHA ANN-MD   ammonia aromatic solution (ammonia  aromatic Solution)    1 EA, Inhale, q15min, PRN: other (see comment)    Status: Ordered Ordered: 06/03/20 Provider: PIETRO,  MELISSA-MD   bupivacaine-fentaNYL 0.125%-2 mcg/mL-NaCl 0.9% 100 mL PREMIX epidural 100 mL (bupivacaine-fentaNYL 0.125%-2 mcg/mL-NaCl 0.9% 100 mL)    12 mL/hr, Epidural, Stop: 06/08/20 9:16:00 EDT    Status: Ordered Ordered: 06/03/20 Provider: PERRI BIRMINGHAM C-MD   calcium carbonate (calcium carbonate 500 mg (200 mg elemental calcium) Chew Tab)    500 mg, 1 tabs, Chewed, TID, PRN: indigestion    Status: Ordered Ordered: 06/03/20 Provider: PIETRO HARTMANN   carboprost POST DELIVERY ONLY (carboprost 250 mcg/mL Inj Soln 1 mL)    250 mcg, 1 mL, IM, On Call, PRN: bleeding    Status: Ordered Ordered: 06/03/20 Provider: SANDIE IHA ANN-MD   Colace (docusate sodium 100 mg Cap)    100 mg, 1 caps, Oral, BID, PRN: constipation    Status: Ordered Ordered: 06/03/20 Provider: PIETRO,  MELISSA-MD   D5NS 1,000 mL (Dextrose 5% with 0.9% NaCl intravenous solution 1,000 mL)    125 mL/hr, IV    Status: Ordered Ordered: 06/03/20 Provider: CRENSHAW,  MELISSA-MD   Dermoplast 20% topical spray (benzocaine 20% Topical Spray)    1 sprays, Topical, QID, PRN: perineal discomfort    Status: Ordered Ordered: 06/03/20 Provider: PIETRO HARTMANN   diphenhydrAMINE (diphenhydrAMINE 25 mg Cap)    25 mg, 1 caps, Oral, q6hr, PRN: other (see comment)    Status: Ordered Ordered: 06/03/20 Provider: PIETRO HARTMANN   diphenhydrAMINE (diphenhydrAMINE 50 mg/mL Inj Soln 1 mL)    25 mg, 0.5 mL, IV Push, q6hr, PRN: other (see comment)    Status: Ordered Ordered: 06/03/20 Provider: PIETRO HARTMANN   diphenhydrAMINE (diphenhydrAMINE 50 mg/mL Inj Soln 1 mL)    25 mg, 0.5 mL, IM, q6hr, PRN: other (see comment)    Status: Ordered Ordered: 06/03/20 Provider: PIETRO HARTMANN   Dulcolax Laxative (bisacodyl 10 mg Rectal Supp)    10 mg, 1 supp, PR, Daily, PRN: constipation    Status: Ordered Ordered:  06/03/20 Provider: PIETRO,  MELISSA-MD   ePHEDrine (ePHEDrine 50 mg/mL Inj Soln 1 mL)    5 mg, 0.1 mL, IV Push, q5min, PRN: other (see comment)    Status: Ordered Ordered: 06/03/20 Provider: PERRI BIRMINGHAM C-MD   Epsom Salt (magnesium sulfate Oral and Topical Powder-Recon 454 gm)    1 app, Topical, q8hr, PRN: perineal discomfort    Status: Ordered Ordered: 06/03/20 Provider: PIETRO,  MELISSA-MD   famotidine (famotidine 20 mg Tab)    20 mg, 1 tabs, Oral, Daily, PRN: indigestion    Status: Ordered Ordered: 06/03/20 Provider: PIETRO HARTMANN   hydrocortisone 2.5% rectal cream with applicator (hydrocortisone 2.5% Rectal Cream 28 gm)    1 app, Topical, QID, PRN: hemorrhoid discomfort    Status: Ordered Ordered: 06/03/20 Provider: PIETRO,  MELISSA-MD   ibuprofen (ibuprofen 800 mg Tab)    800 mg, 1 tabs, Oral, q8hr-INT    Status: Ordered Ordered: 06/03/20 Provider: PIETRO,  MELISSA-MD   influenza virus vaccine, inactivated (influenza virus vaccine (FLUARIX), inactivated PF quad susp 2021-2022)    0.5 mL, IM, Once    Status: Ordered Ordered: 06/03/20 Provider: SANDIE IHA ANN-MD   lidocaine 1% injectable solution (lidocaine 1% Inj Soln 50 mL)    50 mL, IM, On Call, PRN: perineal discomfort    Status: Ordered Ordered: 06/03/20 Provider:  CRUTE,  KIMBERLY ANN-MD   Lomotil (atropine-diphenoxylate 0.025 mg-2.5 mg Tab)    2 tabs, Oral, On Call    Status: Ordered Ordered: 06/03/20 Provider: SANDIE IHA ANN-MD   Lomotil (atropine-diphenoxylate 0.025 mg-2.5 mg Tab)    1 tabs, Oral, On Call    Status: Ordered Ordered: 06/03/20 Provider: SANDIE IHA ANN-MD   methylergonovine POST DELIVERY ONLY (methylergonovine 0.2 mg/mL Inj Soln 1 mL)    0.2 mg, 1 mL, IM, On Call, PRN: bleeding    Status: Ordered Ordered: 06/03/20 Provider: SANDIE IHA ANN-MD   methylergonovine POST DELIVERY ONLY (methylergonovine 0.2 mg Tab)    0.2 mg, 1 tabs, Oral, q4hr, PRN: bleeding    Status: Ordered Ordered: 06/03/20 Provider:  SANDIE IHA ANN-MD   misoprostol POST DELIVERY ONLY (miSOPROStol  200 mcg Tab)    800 mcg, 4 tabs, PR, On Call, PRN: bleeding    Status: Ordered Ordered: 06/03/20 Provider: SANDIE IHA ANN-MD   New Consult - Check Clinical Pharmacy MPTL    1 EA, N/A, On Call    Status: Ordered Ordered: 06/03/20 Provider: SYSTEM,  SYSTEM   NIFEdipine (NIFEdipine 10 mg Cap)    10 mg, 1 caps, Oral, On Call, PRN: hypertension severe    Status: Ordered Ordered: 06/03/20 Provider: PIETRO,  MELISSA-MD   NS 1,000 mL (Sodium Chloride 0.9% intravenous solution 1,000 mL)    75 mL/hr, IV    Status: Ordered Ordered: 06/03/20 Provider: SANDIE IHA ANN-MD   ondansetron (ondansetron 4 mg Tab)    4 mg, 1 tabs, Oral, q6hr, PRN: nausea/vomiting    Status: Ordered Ordered: 06/03/20 Provider: PIETRO,  MELISSA-MD   ondansetron (ondansetron 2 mg/mL Inj Soln 2 mL)    4 mg, 2 mL, IV Push, q6hr, PRN: nausea/vomiting    Status: Ordered Ordered: 06/03/20 Provider: PIETRO HARTMANN   oxytocin IV additive 30 units + Sodium Chloride 0.9% 500 mL (oxytocin 30 units + premix sodium chloride 0.9%..... 500 mL)    TITRATE, IV    Status: Ordered Ordered: 06/03/20 Provider: SANDIE IHA ANN-MD   oxytocin IV additive 30 units + Sodium Chloride 0.9% 500 mL (oxytocin 30 units + premix sodium chloride 0.9%..... 500 mL)    500 mL/hr, IV Bolus    Status: Ordered Ordered: 06/03/20 Provider: SANDIE IHA ANN-MD   oxytocin POST DELIVERY ONLY (oxytocin 10 units/mL Inj Soln 1 mL)    10 units, 1 mL, IM, On Call, PRN: bleeding    Status: Ordered Ordered: 06/03/20 Provider: SANDIE IHA ANN-MD   Phenergan (promethazine 25 mg/mL Inj Soln 1 mL)    50 mg, 2 mL, IM, On Call    Status: Ordered Ordered: 06/03/20 Provider: SANDIE IHA ANN-MD   phenylephrine (phenylephrine 1 mg/10 mL-NaCl 0.9% Sol)    100 mcg, 1 mL, IV Push, q5min, PRN: other (see comment)    Status: Ordered Ordered: 06/03/20 Provider: PERRI BIRMINGHAM C-MD   promethazine (promethazine  25 mg Tab)    25 mg, 1 tabs, Oral, q6hr, PRN: nausea/vomiting    Status: Ordered Ordered: 06/03/20 Provider: PIETRO HARTMANN   promethazine (promethazine 25 mg/mL Inj Soln 1 mL)    25 mg, 1 mL, IM, q6hr, PRN: nausea/vomiting    Status: Ordered Ordered: 06/03/20 Provider: PIETRO HARTMANN   promethazine (promethazine 25 mg Rectal Supp)    25 mg, 1 supp, PR, q6hr, PRN: nausea/vomiting    Status: Ordered Ordered: 06/03/20 Provider: PIETRO,  MELISSA-MD   silver nitrate topical  stick (silver nitrate Stick)    1 app, Topical, On Call, PRN: bleeding    Status: Ordered Ordered: 06/03/20 Provider: SANDIE IHA ANN-MD   simethicone (simethicone 80 mg Chew Tab)    80 mg, 1 tabs, Chewed, QID, PRN: gas    Status: Ordered Ordered: 06/03/20 Provider: PIETRO,  MELISSA-MD   tetanus/diphth/pertuss (Tdap) adult/adol 5 units-2.5 units-18.5 mcg/0.5 mL intramuscular suspension (tetanus/diphth/pertuss (Tdap) adult/adol 5 units-2.5 units-18.5 mcg/0.5 mL IM Susp 0.5 mL)    0.5 mL, IM, Once, PRN: other (see comment)    Status: Ordered Ordered: 06/03/20 Provider: PIETRO HARTMANN   Tranexamic acid IVPB (for PPH) + Sodium Chloride 0.9% 100 mL (tranexamic acid 1000mg / 100 mL NS ADM + Premix ADM sodium chloride inj...SABRASABRASABRA 100 mL)    1,000 mg, 100 mL, 600 mL/hr, IV Piggyback, On Call, PRN: bleeding    Status: Ordered Ordered: 06/03/20 Provider: SANDIE IHA ANN-MD    --------------------------------------------------------------------------------------------------------------------------------------------   [name] [name], [name] [name]  DOB: May 29, 1990  Immunizations     No immunizations have been administered or recorded as given    --------------------------------------------------------------------------------------------------------------------------------------------   [name] [name], [name] [name]  DOB: 10-Feb-1991  Menstrual History  Last Recorded Menstrual Period:  08/22/2019  Last Menstrual Period Description:  --  Menarche Onset:  --  Menarche  Frequency:  --  Menarche Length:  --  Date of Menses Prior to LMP:  --  On Hormonal Contracept within 2 months of LMP:  --  Date of Home Pregnancy Test:  --  Comments:  --    --------------------------------------------------------------------------------------------------------------------------------------------   [name] [name], Marylou  DOB: 09/09/1990  Pregnancy History   G2 P1(1,0,0,1)       Pregnancy # 1          Baby 1             Outcome Date: 05/21/2017 Outcome or Result: Vaginal   Gest Age: 34 weeks 4 days       Birth Outcome: Live Birth   Birth Sex: Female Wt:  3374 g   Maternal Complications: Fever greater than 100.4   Neonate Complications: Fever, Neonatal   Anesthesia Type: Epidural    --------------------------------------------------------------------------------------------------------------------------------------------   Skog, Tonisha  DOB: 24-Jun-1990  Medical History  Past Medical History   No active past medical history has been recorded    Family History   No active positive family history has been recorded    Procedure or Surgical History   Emergency department visit for the evaluation and management of a patient, which requires these 3 key components: A detailed history; A detailed examination; and Medical decision making of moderate complexity. Counseling and/or coordination of care with o   Age: 21 Years Date: 05/23/2020   Intravenous infusion, hydration; each additional hour (List separately in addition to code for primary procedure)   Age: 49 Years Date: 05/23/2020   Therapeutic, prophylactic, or diagnostic injection (specify substance or drug); each additional sequential intravenous push of a new substance/drug (List separately in addition to code for primary procedure)   Age: 43 Years Date: 05/23/2020   Therapeutic, prophylactic, or diagnostic injection (specify substance or drug); intravenous push, single or initial substance/drug   Age: 56 Years Date:  05/23/2020    --------------------------------------------------------------------------------------------------------------------------------------------   Brunn, Kelsay  DOB: 08-30-1990  Social & Psychosocial History  Social History  Alcohol    Denies    Substance Abuse    Opioid Naive - not currently taking opioids, Denies    Tobacco    Tobacco use: Former smoker, quit more than 30 days  ago.    Therapist, sports    Former use, quit more than 90 days ago Electronic Cigarette Use.    Psychosocial History  Family/Social  Father of Baby Involved: Yes      --------------------------------------------------------------------------------------------------------------------------------------------   Parrella, Naydeline  DOB: 15-Sep-1990  Infection History     Infection history negative or not recorded    --------------------------------------------------------------------------------------------------------------------------------------------   Nop, Ysabelle  DOB: Oct 26, 1990  Anesthesia and Transfusions  Prior Anesthesia or Transfusion Received:  No prior anesthesia  Prior Anesthesia Reaction(s):  --  Prior Transfusion Reaction(s):  --  Blood Transfusion Acceptable to Patient:  Yes     --------------------------------------------------------------------------------------------------------------------------------------------   Meding, Dacey  DOB: [birth year]  Birth Plan and Patient Requests  Prenatal Education: Never  Written Birth Plan: --  Written Birth Plan Location: --  Oral Intake OB: --  Support Person/Coach Relationship to Pt: Father of baby, Significant other  Labor Preferences: None  Non-Medicinal Pain Relief: None  Anesthesia/Pain Medication During Labor: Epidural  Delivery Plan: --  Infant Feeding: Exclusive breast milk  Circumcision: --  Baby For Adoption: No  Patient Requests: --  Pediatrician Selected: [facility], Dr. Dallas Carls  Surrogate Pregnancy: --  Support Person's Name:  Tera    --------------------------------------------------------------------------------------------------------------------------------------------   Odland, Karolyne  DOB: 07-19-1990  Education    Prenatal Educational Materials/Leaflets Provided  Title/Topic     Date Provided  Storing Expressed Milk     06/05/20  After Delivery: When to Call the Healthcare Provider     06/05/20  Understanding Postpartum Depression     06/05/20  Nutrition While Breastfeeding     06/05/20  Breast Care After Birth     06/05/20  After Giving Birth: How to Feel Healthy     06/05/20  After a Vaginal Birth     06/05/20  Form - Fetal Movement Counts     05/23/20    Postpartum Patient Education   Activity Expectations: Verbalizes understanding (05/23/20 - Porter Outlaw)   Breast Care: Merrell understanding (06/05/20 - Reba,  Velia U-RN)   Cramps: Verbalizes understanding (06/05/20 - Reba Velia U-RN)   Incision/Episiotomy Care: Merrell understanding (06/05/20 - Reba,  Velia U-RN)   Lochia Changes: Verbalizes understanding (06/05/20 - Reba,  Velia U-RN)   Medication Dosage, Route, Scheduling: Verbalizes understanding (06/05/20 - Reba Velia U-RN)   Pain Management: Verbalizes understanding (06/05/20 - Reba Velia AWE)   Perineal Care: Verbalizes understanding (06/05/20 - Reba Velia U-RN)   Postpartum Booklet Given to Patient: Yes (06/05/20 [place] Reba Velia U-RN)   Postpartum Depression: Verbalizes understanding (06/05/20 - Reba Velia AWE)   SafetyBETHA Merrell understanding (06/05/20 - Reba Velia AWE)   Safety, FallBETHA Merrell understanding (06/05/20 - Reba,  Velia AWE)   Sitz [place]: Verbalizes understanding (06/05/20 - Reba Velia U-RN)    --------------------------------------------------------------------------------------------------------------------------------------------   Fasig, Letita  DOB: 1991-01-26  Registration and Pregnancy Information   Race:  White Ethnicity:  Not Hispanic or Latino   Marital  Status:  Single   Language(s):  English   Religious Preference(s):  No Religious Pref   Occupation/Education:  See social history above if documented.    Address, Phone, and Health Plans   Home Address:  [address] [place], [place], [place] [address]   Phone:  [phone] (Home), [phone] (Mobile), --     Health Plans:    1 - [place] [employer]    Member/Group:  [id number]    Deductible: $  --  Type:  Medicaid/Welfare    Address:  [id number], [place], [place] [id number]    Phone:  [phone]    General Prenatal Information   OB Provider(s) Information:  Not explicitly recorded. May be noted in prenatal encounter section below.   **See below for detailed information for all visits and information.     Delivery Center/Hospital Information:  --   Newborn Provider Information:  [facility], Dr. Dallas Carls   Referring Provider Information:  CLAUDENE LUKES A-MD   Primary Provider Information:  CARLS DALLAS J-DO   Husband/Partner Information:  --   --   Support Person Information:  Tera  (Father of baby, Significant other)   Planned/Unplanned Pregnancy:  --   Date Consent Signed for Tubal Ligation:  --   Date Prenatal Record Sent to Hospital:  --    --------------------------------------------------------------------------------------------------------------------------------------------   [name] [name]  DOB: 12/04/1990  Prenatal Visits and Encounters  (Known encounters since 09/02/2019. May include lab encounters.)  Date  Location Provider Type Medical Service  06/03/2020 01 DEFAULT,  DOCTOR [facility]-Inpatient [facility] MAT-Maternity  06/03/2020 01 CLAUDENE LUKES A-MD [facility]patient [facility] MAT-Maternity  06/03/2020 01 DEFAULT,  DOCTOR IPE-Inpatient [facility] MAT-Maternity  06/03/2020 01 CLAUDENE LUKES A-MD [facility]-Inpatient [facility] MAT-Maternity  06/03/2020 01 CRUTE,  KIMBERLY ANN-MD IPE-Inpatient [facility] MAT-Maternity  06/03/2020 01 CLAUDENE LUKES A-MD IPE-Inpatient [facility] MAT-Maternity  06/03/2020 01 CLAUDENE LUKES  A-MD IPE-Inpatient [facility] MAT-Maternity  05/23/2020 [facility] ED OB DEFAULT,  KENDALL [place] Obstetrics [facility] EOE-ER Obstetrics [facility]  05/23/2020 [facility] ED OB PIETRO HARTMANN EOE-ER Obstetrics [facility] EOE-ER Obstetrics [facility]    --------------------------------------------------------------------------------------------------------------------------------------------   [name] [name]  DOB: 22-Jul-1990  Visit / Encounter Location Information  **The following information represents known location and contact information for locations visited during pregnancy   [facility] Address:          [address]          [place], [place]  [address]    [phone] (Business)    --------------------------------------------------------------------------------------------------------------------------------------------   [name] [name]  DOB: 06-04-1990  Prenatal Visit Diagnosis   (Note:  All diagnoses documented since 09/02/2019)  06/03/20 - 39 WKS  (-- --, Date:         )  06/03/20 - 39 WKS  (-- --, Date:         )  06/03/20 - Encounter for full-term uncomplicated delivery  (ICD-10-CM: O80, Date: 06/03/20)  05/23/20 - Dehydration  (ICD-10-CM: E86.0, Date:         )  05/23/20 - Late vomiting of pregnancy  (ICD-10-CM: O21.2, Date:         )  05/23/20 - Dehydration  (ICD-10-CM: E86.0, Date:         )  05/23/20 - Diseases of the digestive system complicating pregnancy, third trimester  (ICD-10-CM: O99.613, Date:         )  05/23/20 - Gastro-esophageal reflux disease without esophagitis  (ICD-10-CM: K21.9, Date:         )  05/23/20 - Endocrine, nutritional and metabolic diseases complicating pregnancy, third trimester  (ICD-10-CM: N00.716, Date:         )  05/23/20 - Hypothyroidism, unspecified  (ICD-10-CM: E03.9, Date:         )  05/23/20 - 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  (ICD-10-CM: O99.820, Date:         )  05/23/20 - [redacted] weeks gestation of pregnancy  (  ICD-10-CM: Z3A.37, Date:          )  05/23/20 - Dehydration  (ICD-10-CM: E86.0, Date:         )  05/23/20 - [redacted] WKS PREGNANT/ABD CRAMPING  (-- --, Date:         )  05/23/20 - [redacted] WKS PREGNANT/ABD CRAMPING  (-- --, Date:         )  05/23/20 - [redacted] weeks gestation of pregnancy  (ICD-10-CM: Z3A.37, Date: 05/23/20)  05/23/20 - Other specified pregnancy related conditions, third trimester  (ICD-10-CM: O26.893, Date: 05/23/20)    --------------------------------------------------------------------------------------------------------------------------------------------   [name] [name]  DOB: 1990-11-01  Delivery Summary  A   Membrane Status Information    ROM Date/Time:  06/03/20 16:25:00    Amniotic Fluid Color/Description:  Clear    Premature Rupture of Membranes:  No    Prolonged Rupture of Membranes:  No   Labor Information    Labor Onset Date/Time:  06/03/20 06:00:00    Length of Labor 1st Stage Hrs Calc:  10.42 hr    Length of Labor 1st Stage:  625 minutes    2nd Stage Onset Date/Time:  06/03/20 16:25:00    Length of Labor 2nd Stage Hrs Calc:  0.33 hr    Length of Labor 2nd Stage:  20 minutes    Length of Labor 3rd Stage:  5 minutes    Labor Onset Methods:  Spontaneous    Precipitous Labor:  No    Prolonged Labor:  No   Fetal Monitoring    FHR Monitoring Method:  Doppler ultrasound   Delivery Information    Delivery Type:  Vaginal    Delivery of Head Date/Time:  06/03/20 16:45:00    Date/Time of Birth:  06/03/20 16:45:00    Placenta Delivery Date/Time:  06/03/20 16:50:00    Placenta Delivery Method:  Spontaneous    Placenta to Pathology:  No    Cord Blood Banking:  No    Cord Blood Sent to Lab:  Yes    Maternal Delivery Complications:  None    Delivery Physician:  JEANNETTA BRUCKNER R-MD   Neonatal Information    Risk Factors:  None    Neonate Complications:  None    Umbilical Cord Description:  3 vessel cord   Infant Data    Gender:  Female    ID Band Number:  [id number]    Neonate Outcome:  Live birth    Security Tag Number:  [id number]    Birth Weight:  3.525  kg    Apgar Score 1 Minute:  8    Apgar Score 5 Minute:  9      --------------------------------------------------------------------------------------------------------------------------------------------  Note: Items documented with '--' had no clinical data which qualified at time of report creation     ***** END OF REPORT *****

## 2020-06-29 NOTE — ED Notes (Signed)
ED Triage Note       ED Triage Adult Entered On:  06/29/2020 23:44 EDT    Performed On:  06/29/2020 23:42 EDT by Belva Crome, RN, [facility]               Triage   Numeric Rating Pain Scale :   6   Chief Complaint :   Pt ambulatory to Ed with co headache all day today, took excedrin this am/ advil at 1900, broke out in a rash 30 min ta, took Benadryl. rash noted diffuse to body.    [place] Mode of Arrival :   Private vehicle   Infectious Disease Documentation :   Document assessment   Patient received chemo or biotherapy last 48 hrs? :   No   Temperature Oral :   36.8 degC(Converted to: 98.2 degF)    Heart Rate Monitored :   131 bpm (>HHI)    Respiratory Rate :   18 br/min   Systolic Blood Pressure :   89 mmHg (LOW)    Diastolic Blood Pressure :   56 mmHg (LOW)    SpO2 :   96 %   Oxygen Therapy :   Room air   Patient presentation :   None of the above   Chief Complaint or Presentation suggest infection :   No   Dosing Weight Obtained By :   Patient stated   Weight Dosing :   73 kg(Converted to: 160 lb 15 oz)    Height :   165 cm(Converted to: 5 ft 5 in)    Body Mass Index Dosing :   27 kg/m2   Mordecai Rasmussen D - 06/29/2020 23:42 EDT   DCP GENERIC CODE   Tracking Acuity :   2   Tracking Group :   ED [facility] [place] [employer]   [place], [place] - 06/29/2020 23:42 EDT   ED General Section :   Document assessment   Pregnancy Status :   Breastfeeding   ED Allergies Section :   Document assessment   ED Reason for Visit Section :   Document assessment   ED Quick Assessment :   Patient appears awake, alert, oriented to baseline. Skin warm and dry. Moves all extremities. Respiration even and unlabored. Appears in no apparent distress.   Belva Crome RN, [place] D - 06/29/2020 23:42 EDT   ID Risk Screen Symptoms   Recent Travel History :   No recent travel   Mordecai Rasmussen D - 06/29/2020 23:42 EDT   Allergies   (As Of: 06/29/2020 23:44:46 EDT)   Allergies (Active)   No Known Allergies  Estimated Onset Date:   Unspecified ; Created ByEarlene Plater, RN,  Tiffany E; Reaction Status:   Active ; Category:   Drug ; Substance:   No Known Allergies ; Type:   Allergy ; Updated By:   Earlene Plater, RN, Louanne Belton; Reviewed Date:   06/29/2020 23:43 EDT        Psycho-Social   Last 3 mo, thoughts killing self/others :   Patient denies   Right click within box for Suspected Abuse policy link. :   None   Feels Safe Where Live :   Yes   [place] - 06/29/2020 23:42 EDT   ED Reason for Visit   (As Of: 06/29/2020 23:44:46 EDT)   Problems(Active)    Group B streptococcus (SNOMED CT  :[id number] )  Name of Problem:   Group B streptococcus ;  Recorder:   SYSTEM,  SYSTEM; Classification:   Medical ; Code:   [id number] ; Last Updated:   05/23/2020 18:50 EDT ; Life Cycle Date:   05/23/2020 ; Life Cycle Status:   Active ; Vocabulary:   SNOMED CT          Diagnoses(Active)    Headache  Date:   06/29/2020 ; Diagnosis Type:   Reason For Visit ; Confirmation:   Complaint of ; Clinical Dx:   Headache ; Classification:   Medical ; Clinical Service:   Emergency medicine ; Code:   PNED ; Probability:   0 ; Diagnosis Code:   [id number]      Rash  Date:   06/29/2020 ; Diagnosis Type:   Reason For Visit ; Confirmation:   Complaint of ; Clinical Dx:   Rash ; Classification:   Medical ; Clinical Service:   Emergency medicine ; Code:   PNED ; Probability:   0 ; Diagnosis Code:   [id number]

## 2020-06-30 LAB — CBC WITH AUTO DIFFERENTIAL
Absolute Baso #: 0 10*3/uL (ref 0.0–0.2)
Absolute Eos #: 0.1 10*3/uL (ref 0.0–0.5)
Absolute Lymph #: 1.4 10*3/uL (ref 1.0–3.2)
Absolute Mono #: 0.3 10*3/uL (ref 0.3–1.0)
Basophils %: 0 % (ref 0.0–2.0)
Eosinophils %: 0.9 % (ref 0.0–7.0)
Hematocrit: 44 % (ref 34.0–47.0)
Hemoglobin: 14.7 g/dL (ref 11.5–15.7)
Immature Grans (Abs): 0.01 10*3/uL (ref 0.00–0.06)
Immature Granulocytes: 0.1 % (ref 0.0–0.6)
Lymphocytes: 18.1 % (ref 15.0–45.0)
MCH: 30.2 pg (ref 27.0–34.5)
MCHC: 33.4 g/dL (ref 32.0–36.0)
MCV: 90.3 fL (ref 81.0–99.0)
MPV: 8.8 fL (ref 7.2–13.2)
Monocytes: 4 % (ref 4.0–12.0)
Neutrophils %: 76.9 % — ABNORMAL HIGH (ref 42.0–74.0)
Neutrophils Absolute: 6.1 10*3/uL (ref 1.6–7.3)
Platelets: 302 10*3/uL (ref 140–440)
RBC: 4.87 x10e6/mcL (ref 3.60–5.20)
RDW: 11.7 % (ref 11.0–16.0)
WBC: 8 10*3/uL (ref 3.8–10.6)

## 2020-06-30 LAB — COMPREHENSIVE METABOLIC PANEL
ALT: 25 U/L (ref 0–35)
AST: 19 U/L (ref 0–35)
Albumin/Globulin Ratio: 1 mmol/L (ref 1.00–2.70)
Albumin: 3.8 g/dL (ref 3.5–5.2)
Alk Phosphatase: 115 U/L (ref 35–117)
Anion Gap: 14 mmol/L (ref 2–17)
BUN: 19 mg/dL (ref 6–20)
CO2: 20 mmol/L — ABNORMAL LOW (ref 22–29)
Calcium: 8.6 mg/dL (ref 8.6–10.0)
Chloride: 103 mmol/L (ref 98–107)
Creatinine: 0.9 mg/dL (ref 0.5–1.0)
GFR African American: 100 mL/min/{1.73_m2} (ref >=90)
GFR Non-African American: 86 mL/min/{1.73_m2} — ABNORMAL LOW (ref >=90)
Globulin: 3.3 g/dL (ref 1.9–4.4)
Glucose: 110 mg/dL — ABNORMAL HIGH (ref 70–99)
OSMOLALITY CALCULATED: 276 mOsm/kg (ref 270–287)
Potassium: 3.5 mmol/L (ref 3.5–5.3)
Sodium: 137 mmol/L (ref 135–145)
Total Bilirubin: 0.4 mg/dL (ref 0.00–1.20)
Total Protein: 7.1 g/dL (ref 6.4–8.3)

## 2020-06-30 LAB — MICROSCOPIC URINALYSIS

## 2020-06-30 LAB — PROTIME-INR
INR: 1 — ABNORMAL LOW (ref 1.5–3.5)
Protime: 13.3 seconds (ref 11.6–14.5)

## 2020-06-30 LAB — URINALYSIS W/ RFLX MICROSCOPIC
Glucose, UA: NEGATIVE mg/dL
Ketones, Urine: 15 mg/dL — AB
Nitrite, Urine: NEGATIVE
Protein, UA: 100 — AB
Specific Gravity, UA: >=1.03 — AB (ref 1.003–1.035)
Urobilinogen, Urine: 1 EU/dL
pH, UA: 6 (ref 4.5–8.0)

## 2020-06-30 LAB — PTT: PTT: 27 seconds (ref 23.3–34.5)

## 2020-06-30 NOTE — Discharge Summary (Signed)
ED Clinical Summary                         [facility]  [place], [place], [address]  [id number]           PERSON INFORMATION  Name: [name] [name], [name] [name] Age:  30 Years DOB: 01/15/1991   Sex: Female Language: English PCP: Renold Genta J-DO   Marital Status:  Single Phone: [phone] Med Service: MED-Medicine   MRN:  [id number] Acct# [id number] Arrival: 06/29/2020 23:40:00   Visit Reason: Rash; Headache; HA/RASH Acuity: 2 LOS: 000 01:17   Address:      [address] [place] [address]  Diagnosis:      Hives; Migraine headache  Printed Prescriptions:            Allergies      No Known Allergies      Medications Administered During Visit:                  Medication Dose Route   ketorolac 15 mg IV Push   prochlorperazine 10 mg IV Push   Sodium Chloride 0.9% 1000 mL IV Piggyback   diphenhydrAMINE 25 mg IV Push   methylPREDNISolone 125 mg IV Push       Patient Medication List:              docusate (Colace 100 mg oral capsule) 1 Capsules Oral (given by mouth) 2 times a day as needed constipation. Refills: 1.  EPINEPHrine (EpiPen 2-Pak 0.3 mg injectable kit) 0.3 Milligram Intramuscular (in a muscle) once as needed itching/allergic reaction. Refills: 0.  ibuprofen (ibuprofen 800 mg oral tablet) 1 Tabs Oral (given by mouth) every 8 hours (interval). Refills: 1.  levothyroxine 0.075 Milligram Oral (given by mouth) every day.  multivitamin, prenatal (Prenatal 1) 1 Tabs Oral (given by mouth) every day.  predniSONE (predniSONE 20 mg oral tablet) 2 Tabs Oral (given by mouth) every day for 5 Days. Refills: 0.         Major Tests and Procedures:  The following procedures and tests were performed during your ED visit.  COMMONPROCEDURES%>  COMMON PROCEDURESCOMMENTS%>          Laboratory Orders  Name Status Details   .UA Micro Completed Urine, Clean Catch, Stat, ST - Stat, Collected, 06/30/20 0:04:00 EDT, Once, 06/30/20 0:04:00 EDT, Nurse collect, 06/30/20 0:04:00 EDT,  Sumner Boast,  MATTHEW C-MD, Print label Y/N, [id number]   CBCDIFF Completed Blood, Stat, ST - Stat, 06/29/20 23:51:00 EDT, 06/29/20 23:51:00 EDT, Nurse collect, HOSKINS,  MATTHEW C-MD, Print label Y/N   CMP Completed Blood, Stat, ST - Stat, 06/29/20 23:51:00 EDT, 06/29/20 23:51:00 EDT, Nurse collect, HOSKINS,  MATTHEW C-MD, Print label Y/N   PT INR Completed Blood, Stat, ST - Stat, 06/29/20 23:51:00 EDT, 06/29/20 23:51:00 EDT, Nurse collect, HOSKINS,  MATTHEW C-MD, Print label Y/N   PTT Completed Blood, Stat, ST - Stat, 06/29/20 23:51:00 EDT, 06/29/20 23:51:00 EDT, Nurse collect, HOSKINS,  MATTHEW C-MD, Print label Y/N   UA Rflx Mic Completed Urine, Clean Catch, Stat, ST - Stat, 06/29/20 23:51:00 EDT, Once, 06/29/20 23:51:00 EDT, Nurse collect, Sumner Boast,  MATTHEW C-MD, Print label Y/N               Radiology Orders  Name Status Details   CT Head or Brain w/o Contrast Ordered 06/29/20 23:52:00 EDT, STAT 1 hour or less, Reason: Headache, new or worsening, Transport Mode:  STRETCHER, Rad Type, pp_set_radiology_subspecialty               Patient Care Orders  Name Status Details   Discharge Patient Ordered 06/30/20 0:41:00 EDT   ED Assessment Adult Completed 06/29/20 23:44:47 EDT, 06/29/20 23:44:47 EDT   ED Secondary Triage Completed 06/29/20 23:44:47 EDT, 06/29/20 23:44:47 EDT   ED Triage Adult Completed 06/29/20 23:40:31 EDT, 06/29/20 23:40:31 EDT   Inpatient 30 Day Readmission Ordered 06/29/20 23:40:31 EDT, This message can only be seen by Nursing, it is not visible to Pharmacy, Laboratory, or Radiology., 06/29/20 23:40:31 EDT             PROVIDER INFORMATION               Provider Role Assigned Maretta Los, [place] C-MD ED Provider 06/29/2020 23:45:35    Lance Bosch A-RN ED Nurse 06/29/2020 23:46:33        Attending Physician:  Rebeca Allegra C-MD     Admit Doc  Sumner Boast,  MATTHEW C-MD     Consulting Doc       VITALS INFORMATION  Vital Sign Triage Latest   Temp Oral ORAL_1%>36.8 degC ORAL%>36.8 degC   Temp  Temporal TEMPORAL_1%> TEMPORAL%>   Temp Intravascular INTRAVASCULAR_1%> INTRAVASCULAR%>   Temp Axillary AXILLARY_1%> AXILLARY%>   Temp Rectal RECTAL_1%> RECTAL%>   02 Sat 96 % 99 %   Respiratory Rate RATE_1%>18 br/min RATE%>16 br/min   Peripheral Pulse Rate PULSE RATE_1%>100 bpm PULSE RATE%>89 bpm   Apical Heart Rate HEART RATE_1%> HEART RATE%>   Blood Pressure BLOOD PRESSURE_1%>/ BLOOD PRESSURE_1%>56 mmHg BLOOD PRESSURE%>96 mmHg / BLOOD PRESSURE%>69 mmHg                 Immunizations      No Immunizations Documented This Visit          DISCHARGE INFORMATION   Discharge Disposition: H Outpt-Sent Home   Discharge Location:    Home   Discharge Date and Time:    06/30/2020 00:57:01   ED Checkout Date and Time:    06/30/2020 00:57:01     DEPART REASON INCOMPLETE INFORMATION               Depart Action Incomplete Reason   Interactive View/I&O MD Decision to leave incomplete               Problems      Active           Group B streptococcus              Smoking Status      Former smoker, quit more than 30 days ago         PATIENT EDUCATION INFORMATION  Instructions:       Migraine Headache; Hives, Easy-to-Read     Follow up:                    With: Address: When:   Follow up with primary care provider  Within 1 week           ED PROVIDER DOCUMENTATION     Patient:   [name] [name], [name] [name]             MRN: [id number]            FIN: [id number]               Age:   30 years     Sex:  Female     DOB:  October 30, 1990   Associated  Diagnoses:   Migraine headache   Author:   Rebeca Allegra C-MD      Basic Information   Time seen: Provider Seen (ST)   ED Provider/Time:    HOSKINS,  MATTHEW C-MD / 06/29/2020 23:45  .   Additional information: Chief Complaint from Nursing Triage Note   Chief Complaint  Chief Complaint: Pt ambulatory to Ed with co headache all day today, took excedrin this am/ advil at 1900, broke out in a rash 30 min ta, took Benadryl. rash noted diffuse to body. (06/29/20 23:42:00).      History of Present Illness   30 year old  female presents emerged department with concerns of headache and hives.  Patient states her headache started this morning, has lasted all day, she stated was made better with Excedrin at about 7 PM and it returned.  Approximately 30 minutes prior to arrival she broke out in diffuse hives, she did take some Benadryl with no relief.  She has no other pertinent medical problems, she denies any shortness of breath, abdominal pain, any mucosal swelling or pain, fevers or chills, nausea, vomiting.  Patient does state that she has a history of migraines and this is similar to her prior migraine headaches.  She is approximately 1 month postpartum after uncomplicated spontaneous vaginal delivery.      Review of Systems             Additional review of systems information: All other systems reviewed and otherwise negative.      Health Status   Allergies:    Allergic Reactions (Selected)  No Known Allergies.      Past Medical/ Family/ Social History   Medical history: Reviewed as documented in chart.   Surgical history:    No active procedure history items have been selected or recorded., Reviewed as documented in chart.   Family history:    No family history items have been selected or recorded., Reviewed as documented in chart.   Social history:    Social & Psychosocial Habits    Alcohol  05/23/2020  Use: Denies    Substance Use  05/23/2020  Opioid Assessment Opioid Naive-not taking    Use: Denies    Tobacco  05/23/2020  Use: Former smoker, quit more    Electronic Cigarette/Vaping  05/23/2020  Electronic Cigarette Use: Former use, quit more tha  , Reviewed as documented in chart.   Problem list:    Active Problems (2)  Breastfeeding (infant)   Group B streptococcus   , per nurse's notes.      Physical Examination               Vital Signs   Vital Signs   0/10/8117 14:78 EDT Systolic Blood Pressure 295 mmHg    Diastolic Blood Pressure 64 mmHg    Heart Rate Monitored 120 bpm  HI    Respiratory Rate 18 br/min    SpO2 98 %    07/27/1306 65:78 EDT Systolic Blood Pressure 89 mmHg  LOW    Diastolic Blood Pressure 56 mmHg  LOW    Temperature Oral 36.8 degC    Heart Rate Monitored 131 bpm  >HHI    Respiratory Rate 18 br/min    SpO2 96 %   .   Measurements   06/29/2020 23:44 EDT Body Mass Index est meas 26.81 kg/m2    Body Mass Index Measured 26.81 kg/m2   06/29/2020 23:42 EDT Height/Length Measured 165 cm    Weight Dosing 73 kg   .  General:  Alert, no acute distress.    Skin:  Warm, dry, Diffuse hives..    Head:  Normocephalic, atraumatic.    Neck:  Supple, trachea midline, Able to range her neck in all directions without any difficulty.    Eye:  Pupils are equal, round and reactive to light, extraocular movements are intact.    Cardiovascular:  Regular rate and rhythm, Normal peripheral perfusion, No edema.    Respiratory:  Lungs are clear to auscultation, respirations are non-labored.    Gastrointestinal:  Soft, Nontender, Non distended.    Musculoskeletal:  Normal ROM, normal strength.    Neurological:  Alert and oriented to person, place, time, and situation, No focal neurological deficit observed, CN II-XII intact, normal sensory observed, normal motor observed, normal speech observed, normal coordination observed.    Psychiatric:  Cooperative, appropriate mood & affect.       Medical Decision Making   Differential Diagnosis:  Viral syndrome.   Documents reviewed:  Emergency department nurses' notes.   Patient presents with headache and hives.  On arrival her initial blood pressure was 89 systolic with a heart rate of 120, she was afebrile.  Upon initial assessment prior to intervention her blood pressure was 100/64 and heart rate did come down to 100.  She has no meningismus, range of motion her neck is normal, she is in no acute distress, ambulatory, otherwise benign exam.  Her hives improved after treatment in the emergency department, headache improved.  Labs are unremarkable.      Impression and Plan   Diagnosis   Migraine headache  (ICD10-CM G43.909, Discharge, Medical)   Plan   Condition: Improved, Stable.    Disposition: Medically cleared, Discharged: to home.    Prescriptions: Launch prescriptions   Pharmacy:  EpiPen 2-Pak 0.3 mg injectable kit (Prescribe): 0.3 mg, IM, Once, PRN: itching/allergic reaction, 1 EA, 0 Refill(s)  predniSONE 20 mg oral tablet (Prescribe): 40 mg, 2 tabs, Oral, Daily, for 5 days, 10 tabs, 0 Refill(s).    Patient was given the following educational materials: Hives, Easy-to-Read, Migraine Headache, Migraine Headache, Hives, Easy-to-Read.    Follow up with: Follow up with primary care provider Within 1 week.    Counseled: Patient, Regarding diagnosis, Regarding diagnostic results, Regarding treatment plan, Patient indicated understanding of instructions.

## 2020-06-30 NOTE — ED Notes (Signed)
 ED Patient Summary       ;          [facility]  [address] [place], [place] [address]  [phone]  Discharge Instructions (Patient)  Name:[name] [name], [name] [name]  DOB:  [birth year]                   MRN: [id number]                   FIN: [id number]  Reason For Visit: Rash; Headache; HA/RASH  Final Diagnosis: Hives; Migraine headache     Visit Date: 06/29/2020 23:40:00  Address: [address] [place] [address]  Phone: [phone]     Emergency Department Providers:         Primary Physician:      MAURO DONNICE BROCKS      [facility] would like to thank you for allowing us  to assist you with your healthcare needs. The following includes patient education materials and information regarding your injury/illness.     Follow-up Instructions:  You were seen today on an emergency basis. Please contact your primary care doctor for a follow up appointment. If you received a referral to a specialist doctor, it is important you follow-up as instructed.    It is important that you call your follow-up doctor to schedule and confirm the location of your next appointment. Your doctor may practice at multiple locations. The office location of your follow-up appointment may be different to the one written on your discharge instructions.    If you do not have a primary care doctor, please call [phone] for help in finding a [name] Provider. For help in finding a specialist doctor, please call [phone].    If your condition gets worse before your follow-up with your primary care doctor or specialist, please return to the Emergency Department.      Coronavirus 2019 (COVID-19) Reminders:     Patients age 45 - 10, with parental consent, and over age 25 can make an appointment for a COVID-19 vaccine. Patients can contact their [facility] doctors' offices to schedule an appointment to receive the COVID-19 vaccine. Patients who do not  have a [facility] physician can call [phone] to schedule vaccination appointments.      Follow Up Appointments:  Primary Care Provider:     Name: [name]     Phone: [phone]                 With: Address: When:   Follow up with primary care provider  Within 1 week              [facility] SERVICES%>             New Medications  Printed Prescriptions  EPINEPHrine (EpiPen 2-Pak 0.3 mg injectable kit) 0.3 Milligram Intramuscular (in a muscle) once as needed itching/allergic reaction. Refills: 0.  Last Dose:____________________  predniSONE (predniSONE 20 mg oral tablet) 2 Tabs Oral (given by mouth) every day for 5 Days. Refills: 0.  Last Dose:____________________  Medications that have not changed  Other Medications  docusate (Colace 100 mg oral capsule) 1 Capsules Oral (given by mouth) 2 times a day as needed constipation. Refills: 1.  Last Dose:____________________  ibuprofen (ibuprofen 800 mg oral tablet) 1 Tabs Oral (given by mouth) every 8 hours (interval). Refills: 1.  Last Dose:____________________  levothyroxine 0.075  Milligram Oral (given by mouth) every day.  Last Dose:____________________  multivitamin, prenatal (Prenatal 1) 1 Tabs Oral (given by mouth) every day.  Last Dose:____________________      Allergy Info: No Known Allergies     Discharge Additional Information          Discharge Patient 06/30/20 0:41:00 EDT      Patient Education Materials:        Migraine Headache    A migraine headache is an intense, throbbing pain on one side or both sides of the head. Migraine headaches may also cause other symptoms, such as nausea, vomiting, and sensitivity to light and noise. A migraine headache can last from 4 hours to 3 days. Talk with your doctor about what things may bring on (trigger) your migraine headaches.      What are the causes?    The exact cause of this condition is not known. However, a migraine may be caused when nerves in the brain become  irritated and release chemicals that cause inflammation of blood vessels. This inflammation causes pain. This condition may be triggered or caused by:   Drinking alcohol.     Smoking.     Taking medicines, such as:  ? Medicine used to treat chest pain (nitroglycerin).    ? Birth control pills.    ? Estrogen.    ? Certain blood pressure medicines.       Eating or drinking products that contain nitrates, glutamate, aspartame, or tyramine. Aged cheeses, chocolate, or caffeine may also be triggers.     Doing physical activity.      Other things that may trigger a migraine headache include:   Menstruation.     Pregnancy.     Hunger.     Stress.     Lack of sleep or too much sleep.     Weather changes.     Fatigue.        What increases the risk?    The following factors may make you more likely to experience migraine headaches:   Being a certain age. This condition is more common in people who are 67?29 years old.     Being female.     Having a family history of migraine headaches.     Being Caucasian.     Having a mental health condition, such as depression or anxiety.     Being obese.        What are the signs or symptoms?    The main symptom of this condition is pulsating or throbbing pain. This pain may:   Happen in any area of the head, such as on one side or both sides.     Interfere with daily activities.     Get worse with physical activity.     Get worse with exposure to bright lights or loud noises.      Other symptoms may include:   Nausea.     Vomiting.     Dizziness.     General sensitivity to bright lights, loud noises, or smells.      Before you get a migraine headache, you may get warning signs (an aura). An aura may include:   Seeing flashing lights or having blind spots.     Seeing bright spots, halos, or zigzag lines.     Having tunnel vision or blurred vision.     Having numbness or a tingling feeling.     Having trouble talking.     Having muscle  weakness.      Some people have symptoms after a  migraine headache (postdromal phase), such as:   Feeling tired.     Difficulty concentrating.        How is this diagnosed?    A migraine headache can be diagnosed based on:   Your symptoms.     A physical exam.     Tests, such as:   ? CT scan or an MRI of the head. These imaging tests can help rule out other causes of headaches.    ? Taking fluid from the spine (lumbar puncture) and analyzing it (cerebrospinal fluid analysis, or CSF analysis).          How is this treated?    This condition may be treated with medicines that:   Relieve pain.     Relieve nausea.     Prevent migraine headaches.      Treatment for this condition may also include:   Acupuncture.     Lifestyle changes like avoiding foods that trigger migraine headaches.     Biofeedback.     Cognitive behavioral therapy.        Follow these instructions at home:    Medicines     Take over-the-counter and prescription medicines only as told by your health care provider.     Ask your health care provider if the medicine prescribed to you:  ? Requires you to avoid driving or using heavy machinery.    ? Can cause constipation. You may need to take these actions to prevent or treat constipation:  ? Drink enough fluid to keep your urine pale yellow.    ? Take over-the-counter or prescription medicines.    ? Eat foods that are high in fiber, such as beans, whole grains, and fresh fruits and vegetables.    ? Limit foods that are high in fat and processed sugars, such as fried or sweet foods.          Lifestyle     Do not drink alcohol.     Do not use any products that contain nicotine or tobacco, such as cigarettes, e-cigarettes, and chewing tobacco. If you need help quitting, ask your health care provider.     Get at least 8 hours of sleep every night.     Find ways to manage stress, such as meditation, deep breathing, or yoga.      General instructions         Keep a journal to find out what may trigger your migraine headaches. For example, write down:  ? What  you eat and drink.    ? How much sleep you get.    ? Any change to your diet or medicines.       If you have a migraine headache:  ? Avoid things that make your symptoms worse, such as bright lights.    ? It may help to lie down in a dark, quiet room.    ? Do not drive or use heavy machinery.    ? Ask your health care provider what activities are safe for you while you are experiencing symptoms.       Keep all follow-up visits as told by your health care provider. This is important.        Contact a health care provider if:     You develop symptoms that are different or more severe than your usual migraine headache symptoms.     You have more than 15  headache days in one month.      Get help right away if:     Your migraine headache becomes severe.     Your migraine headache lasts longer than 72 hours.     You have a fever.     You have a stiff neck.     You have vision loss.     Your muscles feel weak or like you cannot control them.     You start to lose your balance often.     You have trouble walking.     You faint.     You have a seizure.      Summary     A migraine headache is an intense, throbbing pain on one side or both sides of the head. Migraines may also cause other symptoms, such as nausea, vomiting, and sensitivity to light and noise.     This condition may be treated with medicines and lifestyle changes. You may also need to avoid certain things that trigger a migraine headache.     Keep a journal to find out what may trigger your migraine headaches.     Contact your health care provider if you have more than 15 headache days in a month or you develop symptoms that are different or more severe than your usual migraine headache symptoms.      This information is not intended to replace advice given to you by your health care provider. Make sure you discuss any questions you have with your health care provider.      Document Revised: 06/04/2018 Document Reviewed: 03/25/2018  Elsevier Patient Education ?  2021 Elsevier Inc.       Hives    Hives are itchy, red, swollen areas on your skin. Hives can show up on any part of your body. Hives often fade within 24 hours (acute hives). New hives can show up after old ones fade. This can go on for many days or weeks (chronic hives). Hives do not spread from person to person (are not contagious).    Hives are caused by your body's response to something that you are allergic to (allergen). These are sometimes called triggers. You can get hives right after being around a trigger, or hours later.      What are the causes?     Allergies to foods.     Insect bites or stings.     Pollen.     Pets.     Latex.     Chemicals.     Spending time in sunlight, heat, or cold.     Exercise.     Stress.     Some medicines.     Viruses. This includes the common cold.     Infections caused by germs (bacteria).     Allergy shots.     Blood transfusions.    Sometimes, the cause is not known.      What increases the risk?     Being a woman.     Being allergic to foods such as:  ? Citrus fruits.    ? Milk.    ? Eggs.    ? Peanuts.    ? Tree nuts.    ? Shellfish.       Being allergic to:  ? Medicines.    ? Latex.    ? Insects.    ? Animals.    ? Pollen.        What are the signs or symptoms?  Raised, itchy, red or white bumps or patches on your skin. These areas may:  ? Get large and swollen.    ? Change in shape and location.    ? Stand alone or connect to each other over a large area of skin.    ? Sting or hurt.    ? Turn white when pressed in the center (blanch).      In very bad cases, your hands, feet, and face may also get swollen. This may happen if hives start deeper in your skin.      How is this treated?    Treatment for this condition depends on your symptoms. Treatment may include:   Using cool, wet cloths (cool compresses) or taking cool showers to stop the itching.     Medicines that help:  ? Relieve itching (antihistamines).    ? Reduce swelling (corticosteroids).    ? Treat  infection (antibiotics).       A medicine (omalizumab) that is given as a shot (injection). Your doctor may prescribe this if you have hives that do not get better even after other treatments.     In very bad cases, you may need a shot of a medicine called epinephrine to prevent a life-threatening allergic reaction (anaphylaxis).        Follow these instructions at home:    Medicines     Take or apply over-the-counter and prescription medicines only as told by your doctor.     If you were prescribed an antibiotic medicine, use it as told by your doctor. Do not stop using it even if you start to feel better.      Skin care     Apply cool, wet cloths to the hives.     Do not scratch your skin. Do not rub your skin.      General instructions     Do not take hot showers or baths. This can make itching worse.     Do not wear tight clothes.     Use sunscreen and wear clothes that cover your skin when you are outside.     Avoid any triggers that cause your hives. Keep a journal to help track what causes your hives. Write down:  ? What medicines you take.    ? What you eat and drink.    ? What products you use on your skin.       Keep all follow-up visits as told by your doctor. This is important.        Contact a doctor if:     Your symptoms are not better with medicine.     Your joints hurt or are swollen.      Get help right away if:     You have a fever.     You have pain in your belly (abdomen).     Your tongue or lips are swollen.     Your eyelids are swollen.     Your chest or throat feels tight.     You have trouble breathing or swallowing.    These symptoms may be an emergency. Do not wait to see if the symptoms will go away. Get medical help right away. Call your local emergency services (911 in the [place] Do not drive yourself to the hospital.      Summary     Hives are itchy, red, swollen areas on your skin.     Treatment for this condition depends on your symptoms.  Avoid things that cause your hives. Keep a  journal to help track what causes your hives.     Take and apply over-the-counter and prescription medicines only as told by your doctor.     Keep all follow-up visits as told by your doctor. This is important.      This information is not intended to replace advice given to you by your health care provider. Make sure you discuss any questions you have with your health care provider.      Document Revised: 04/01/2019 Document Reviewed: 08/26/2017  Elsevier Patient Education ? 2021 Elsevier Inc.      ---------------------------------------------------------------------------------------------------------------------  [facility] allows patients to review your COVID and other test results as well as discharge documents from any [name], Emergency Department, surgical center or outpatient lab. Test results are typically available 36 hours after the test is completed.     [facility] encourages you to self-enroll in the [facility] Patient Portal.     To begin your self-enrollment process, please visit [email or web address] Under [facility], click on "Sign up now".     NOTE: You must be 16 years and older to use [facility] Self-Enroll online. If you are a parent, caregiver, or guardian; you need an invite to access your child's or dependent's health records. To obtain an invite, contact the Medical Records department at [phone] Monday through Friday, 8-4:30, select option 3 . If we receive your call afterhours, we will return your call the next business day.     If you have issues trying to create or access your account, contact [employer] support at [phone] available 7 days a week 24 hours a day.     Comment:

## 2020-06-30 NOTE — ED Notes (Signed)
ED Patient Education Note     Patient Education Materials Follows:  Dermatology     Hives    Hives are itchy, red, swollen areas on your skin. Hives can show up on any part of your body. Hives often fade within 24 hours (acute hives). New hives can show up after old ones fade. This can go on for many days or weeks (chronic hives). Hives do not spread from person to person (are not contagious).    Hives are caused by your body's response to something that you are allergic to (allergen). These are sometimes called triggers. You can get hives right after being around a trigger, or hours later.      What are the causes?     Allergies to foods.     Insect bites or stings.     Pollen.     Pets.     Latex.     Chemicals.     Spending time in sunlight, heat, or cold.     Exercise.     Stress.     Some medicines.     Viruses. This includes the common cold.     Infections caused by germs (bacteria).     Allergy shots.     Blood transfusions.    Sometimes, the cause is not known.      What increases the risk?     Being a woman.     Being allergic to foods such as:  ? Citrus fruits.    ? Milk.    ? Eggs.    ? Peanuts.    ? Tree nuts.    ? Shellfish.       Being allergic to:  ? Medicines.    ? Latex.    ? Insects.    ? Animals.    ? Pollen.        What are the signs or symptoms?       Raised, itchy, red or white bumps or patches on your skin. These areas may:  ? Get large and swollen.    ? Change in shape and location.    ? Stand alone or connect to each other over a large area of skin.    ? Sting or hurt.    ? Turn white when pressed in the center (blanch).      In very bad cases, your hands, feet, and face may also get swollen. This may happen if hives start deeper in your skin.      How is this treated?    Treatment for this condition depends on your symptoms. Treatment may include:   Using cool, wet cloths (cool compresses) or taking cool showers to stop the itching.     Medicines that help:  ? Relieve itching  (antihistamines).    ? Reduce swelling (corticosteroids).    ? Treat infection (antibiotics).       A medicine (omalizumab) that is given as a shot (injection). Your doctor may prescribe this if you have hives that do not get better even after other treatments.     In very bad cases, you may need a shot of a medicine called epinephrine to prevent a life-threatening allergic reaction (anaphylaxis).        Follow these instructions at home:    Medicines     Take or apply over-the-counter and prescription medicines only as told by your doctor.     If you were prescribed an antibiotic medicine, use it as told   by your doctor. Do not stop using it even if you start to feel better.      Skin care     Apply cool, wet cloths to the hives.     Do not scratch your skin. Do not rub your skin.      General instructions     Do not take hot showers or baths. This can make itching worse.     Do not wear tight clothes.     Use sunscreen and wear clothes that cover your skin when you are outside.     Avoid any triggers that cause your hives. Keep a journal to help track what causes your hives. Write down:  ? What medicines you take.    ? What you eat and drink.    ? What products you use on your skin.       Keep all follow-up visits as told by your doctor. This is important.        Contact a doctor if:     Your symptoms are not better with medicine.     Your joints hurt or are swollen.      Get help right away if:     You have a fever.     You have pain in your belly (abdomen).     Your tongue or lips are swollen.     Your eyelids are swollen.     Your chest or throat feels tight.     You have trouble breathing or swallowing.    These symptoms may be an emergency. Do not wait to see if the symptoms will go away. Get medical help right away. Call your local emergency services (911 in the [place] Do not drive yourself to the hospital.      Summary     Hives are itchy, red, swollen areas on your skin.     Treatment for this condition  depends on your symptoms.     Avoid things that cause your hives. Keep a journal to help track what causes your hives.     Take and apply over-the-counter and prescription medicines only as told by your doctor.     Keep all follow-up visits as told by your doctor. This is important.      This information is not intended to replace advice given to you by your health care provider. Make sure you discuss any questions you have with your health care provider.      Document Revised: 04/01/2019 Document Reviewed: 08/26/2017  Elsevier Patient Education ? 2021 [employer]      Neurology     Migraine Headache    A migraine headache is an intense, throbbing pain on one side or both sides of the head. Migraine headaches may also cause other symptoms, such as nausea, vomiting, and sensitivity to light and noise. A migraine headache can last from 4 hours to 3 days. Talk with your doctor about what things may bring on (trigger) your migraine headaches.      What are the causes?    The exact cause of this condition is not known. However, a migraine may be caused when nerves in the brain become irritated and release chemicals that cause inflammation of blood vessels. This inflammation causes pain. This condition may be triggered or caused by:   Drinking alcohol.     Smoking.     Taking medicines, such as:  ? Medicine used to treat chest pain (nitroglycerin).    ?  Birth control pills.    ? Estrogen.    ? Certain blood pressure medicines.       Eating or drinking products that contain nitrates, glutamate, aspartame, or tyramine. Aged cheeses, chocolate, or caffeine may also be triggers.     Doing physical activity.      Other things that may trigger a migraine headache include:   Menstruation.     Pregnancy.     Hunger.     Stress.     Lack of sleep or too much sleep.     Weather changes.     Fatigue.        What increases the risk?    The following factors may make you more likely to experience migraine headaches:   Being a  certain age. This condition is more common in people who are 7?29 years old.     Being female.     Having a family history of migraine headaches.     Being Caucasian.     Having a mental health condition, such as depression or anxiety.     Being obese.        What are the signs or symptoms?    The main symptom of this condition is pulsating or throbbing pain. This pain may:   Happen in any area of the head, such as on one side or both sides.     Interfere with daily activities.     Get worse with physical activity.     Get worse with exposure to bright lights or loud noises.      Other symptoms may include:   Nausea.     Vomiting.     Dizziness.     General sensitivity to bright lights, loud noises, or smells.      Before you get a migraine headache, you may get warning signs (an aura). An aura may include:   Seeing flashing lights or having blind spots.     Seeing bright spots, halos, or zigzag lines.     Having tunnel vision or blurred vision.     Having numbness or a tingling feeling.     Having trouble talking.     Having muscle weakness.      Some people have symptoms after a migraine headache (postdromal phase), such as:   Feeling tired.     Difficulty concentrating.        How is this diagnosed?    A migraine headache can be diagnosed based on:   Your symptoms.     A physical exam.     Tests, such as:   ? CT scan or an MRI of the head. These imaging tests can help rule out other causes of headaches.    ? Taking fluid from the spine (lumbar puncture) and analyzing it (cerebrospinal fluid analysis, or CSF analysis).          How is this treated?    This condition may be treated with medicines that:   Relieve pain.     Relieve nausea.     Prevent migraine headaches.      Treatment for this condition may also include:   Acupuncture.     Lifestyle changes like avoiding foods that trigger migraine headaches.     Biofeedback.     Cognitive behavioral therapy.        Follow these instructions at home:    Medicines      Take over-the-counter and prescription medicines only as told by  your health care provider.     Ask your health care provider if the medicine prescribed to you:  ? Requires you to avoid driving or using heavy machinery.    ? Can cause constipation. You may need to take these actions to prevent or treat constipation:  ? Drink enough fluid to keep your urine pale yellow.    ? Take over-the-counter or prescription medicines.    ? Eat foods that are high in fiber, such as beans, whole grains, and fresh fruits and vegetables.    ? Limit foods that are high in fat and processed sugars, such as fried or sweet foods.          Lifestyle     Do not drink alcohol.     Do not use any products that contain nicotine or tobacco, such as cigarettes, e-cigarettes, and chewing tobacco. If you need help quitting, ask your health care provider.     Get at least 8 hours of sleep every night.     Find ways to manage stress, such as meditation, deep breathing, or yoga.      General instructions         Keep a journal to find out what may trigger your migraine headaches. For example, write down:  ? What you eat and drink.    ? How much sleep you get.    ? Any change to your diet or medicines.       If you have a migraine headache:  ? Avoid things that make your symptoms worse, such as bright lights.    ? It may help to lie down in a dark, quiet room.    ? Do not drive or use heavy machinery.    ? Ask your health care provider what activities are safe for you while you are experiencing symptoms.       Keep all follow-up visits as told by your health care provider. This is important.        Contact a health care provider if:     You develop symptoms that are different or more severe than your usual migraine headache symptoms.     You have more than 15 headache days in one month.      Get help right away if:     Your migraine headache becomes severe.     Your migraine headache lasts longer than 72 hours.     You have a fever.     You have a  stiff neck.     You have vision loss.     Your muscles feel weak or like you cannot control them.     You start to lose your balance often.     You have trouble walking.     You faint.     You have a seizure.      Summary     A migraine headache is an intense, throbbing pain on one side or both sides of the head. Migraines may also cause other symptoms, such as nausea, vomiting, and sensitivity to light and noise.     This condition may be treated with medicines and lifestyle changes. You may also need to avoid certain things that trigger a migraine headache.     Keep a journal to find out what may trigger your migraine headaches.     Contact your health care provider if you have more than 15 headache days in a month or you develop symptoms that are different  or more severe than your usual migraine headache symptoms.      This information is not intended to replace advice given to you by your health care provider. Make sure you discuss any questions you have with your health care provider.      Document Revised: 06/04/2018 Document Reviewed: 03/25/2018  Elsevier Patient Education ? 2021 Elsevier Inc.

## 2020-06-30 NOTE — ED Provider Notes (Signed)
General Medical Problem *ED        Patient:   [name] [name], [name] [name]             MRN: [id number]            FIN: [id number]               Age:   30 years     Sex:  Female     DOB:  1990-06-08   Associated Diagnoses:   Migraine headache   Author:   Rebeca Allegra C-MD      Basic Information   Time seen: Provider Seen (ST)   ED Provider/Time:    Najae Rathert,  Kerriann Kamphuis C-MD / 06/29/2020 23:45  .   Additional information: Chief Complaint from Nursing Triage Note   Chief Complaint  Chief Complaint: Pt ambulatory to Ed with co headache all day today, took excedrin this am/ advil at 1900, broke out in a rash 30 min ta, took Benadryl. rash noted diffuse to body. (06/29/20 23:42:00).      History of Present Illness   30 year old female presents emerged department with concerns of headache and hives.  Patient states her headache started this morning, has lasted all day, she stated was made better with Excedrin at about 7 PM and it returned.  Approximately 30 minutes prior to arrival she broke out in diffuse hives, she did take some Benadryl with no relief.  She has no other pertinent medical problems, she denies any shortness of breath, abdominal pain, any mucosal swelling or pain, fevers or chills, nausea, vomiting.  Patient does state that she has a history of migraines and this is similar to her prior migraine headaches.  She is approximately 1 month postpartum after uncomplicated spontaneous vaginal delivery.      Review of Systems             Additional review of systems information: All other systems reviewed and otherwise negative.      Health Status   Allergies:    Allergic Reactions (Selected)  No Known Allergies.      Past Medical/ Family/ Social History   Medical history: Reviewed as documented in chart.   Surgical history:    No active procedure history items have been selected or recorded., Reviewed as documented in chart.   Family history:    No family history items have been selected or recorded., Reviewed as documented in  chart.   Social history:    Social & Psychosocial Habits    Alcohol  05/23/2020  Use: Denies    Substance Use  05/23/2020  Opioid Assessment Opioid Naive-not taking    Use: Denies    Tobacco  05/23/2020  Use: Former smoker, quit more    Electronic Cigarette/Vaping  05/23/2020  Electronic Cigarette Use: Former use, quit more tha  , Reviewed as documented in chart.   Problem list:    Active Problems (2)  Breastfeeding (infant)   Group B streptococcus   , per nurse's notes.      Physical Examination               Vital Signs   Vital Signs   2/0/2542 70:62 EDT Systolic Blood Pressure 376 mmHg    Diastolic Blood Pressure 64 mmHg    Heart Rate Monitored 120 bpm  HI    Respiratory Rate 18 br/min    SpO2 98 %   04/03/3149 76:16 EDT Systolic Blood Pressure 89 mmHg  LOW    Diastolic Blood Pressure 56  mmHg  LOW    Temperature Oral 36.8 degC    Heart Rate Monitored 131 bpm  >HHI    Respiratory Rate 18 br/min    SpO2 96 %   .   Measurements   06/29/2020 23:44 EDT Body Mass Index est meas 26.81 kg/m2    Body Mass Index Measured 26.81 kg/m2   06/29/2020 23:42 EDT Height/Length Measured 165 cm    Weight Dosing 73 kg   .   General:  Alert, no acute distress.    Skin:  Warm, dry, Diffuse hives..    Head:  Normocephalic, atraumatic.    Neck:  Supple, trachea midline, Able to range her neck in all directions without any difficulty.    Eye:  Pupils are equal, round and reactive to light, extraocular movements are intact.    Cardiovascular:  Regular rate and rhythm, Normal peripheral perfusion, No edema.    Respiratory:  Lungs are clear to auscultation, respirations are non-labored.    Gastrointestinal:  Soft, Nontender, Non distended.    Musculoskeletal:  Normal ROM, normal strength.    Neurological:  Alert and oriented to person, place, time, and situation, No focal neurological deficit observed, CN II-XII intact, normal sensory observed, normal motor observed, normal speech observed, normal coordination observed.    Psychiatric:   Cooperative, appropriate mood & affect.       Medical Decision Making   Differential Diagnosis:  Viral syndrome.   Documents reviewed:  Emergency department nurses' notes.   Patient presents with headache and hives.  On arrival her initial blood pressure was 89 systolic with a heart rate of 120, she was afebrile.  Upon initial assessment prior to intervention her blood pressure was 100/64 and heart rate did come down to 100.  She has no meningismus, range of motion her neck is normal, she is in no acute distress, ambulatory, otherwise benign exam.  Her hives improved after treatment in the emergency department, headache improved.  Labs are unremarkable.      Impression and Plan   Diagnosis   Migraine headache (ICD10-CM G43.909, Discharge, Medical)   Plan   Condition: Improved, Stable.    Disposition: Medically cleared, Discharged: to home.    Prescriptions: Launch prescriptions   Pharmacy:  EpiPen 2-Pak 0.3 mg injectable kit (Prescribe): 0.3 mg, IM, Once, PRN: itching/allergic reaction, 1 EA, 0 Refill(s)  predniSONE 20 mg oral tablet (Prescribe): 40 mg, 2 tabs, Oral, Daily, for 5 days, 10 tabs, 0 Refill(s).    Patient was given the following educational materials: Hives, Easy-to-Read, Migraine Headache, Migraine Headache, Hives, Easy-to-Read.    Follow up with: Follow up with primary care provider Within 1 week.    Counseled: Patient, Regarding diagnosis, Regarding diagnostic results, Regarding treatment plan, Patient indicated understanding of instructions.    Signature Line     Electronically Signed on 06/30/2020 12:44 AM EDT   ________________________________________________   Rebeca Allegra C-MD               Modified by: Rebeca Allegra C-MD on 06/30/2020 12:29 AM EDT      Modified by: Rebeca Allegra C-MD on 06/30/2020 12:31 AM EDT      Modified by: Rebeca Allegra C-MD on 06/30/2020 12:35 AM EDT      Modified by: Sumner Boast,  Ronith Berti C-MD on 06/30/2020 12:38 AM EDT      Modified by: Sumner Boast,  Liviah Cake  C-MD on 06/30/2020 12:44 AM EDT

## 2020-06-30 NOTE — ED Notes (Signed)
ED Triage Note       ED Secondary Triage Entered On:  06/30/2020 0:28 EDT    Performed On:  06/30/2020 0:26 EDT by Clerance Lav A-RN               General Information   Barriers to Learning :   Acuity of Illness   ED Home Meds Section :   Document assessment   [facility] ED Fall Risk Section :   Document assessment   ED Advance Directives Section :   Document assessment   ED Palliative Screen :   N/A (prefilled for <65yo)   Clerance Lav A-RN - 06/30/2020 0:26 EDT   (As Of: 06/30/2020 00:28:02 EDT)   Problems(Active)    Breastfeeding (infant) (IMO  :412878 )  Name of Problem:   Breastfeeding (infant) ; Recorder:   SYSTEM,  SYSTEM; Confirmation:   Confirmed ; Classification:   Patient Stated ; Code:   [id number] ; Last Updated:   06/29/2020 23:44 EDT ; Life Cycle Date:   06/29/2020 ; Life Cycle Status:   Active ; Vocabulary:   IMO        Group B streptococcus (SNOMED CT  :947096283 )  Name of Problem:   Group B streptococcus ; Recorder:   SYSTEM,  SYSTEM; Classification:   Medical ; Code:   662947654 ; Last Updated:   05/23/2020 18:50 EDT ; Life Cycle Date:   05/23/2020 ; Life Cycle Status:   Active ; Vocabulary:   SNOMED CT        Hypothyroid (SNOMED CT  :65035465 )  Name of Problem:   Hypothyroid ; Recorder:   Clerance Lav A-RN; Confirmation:   Confirmed ; Classification:   Patient Stated ; Code:   [id number] ; Contributor System:   Dietitian ; Last Updated:   06/30/2020 0:28 EDT ; Life Cycle Date:   06/30/2020 ; Life Cycle Status:   Active ; Vocabulary:   SNOMED CT          Diagnoses(Active)    Headache  Date:   06/29/2020 ; Diagnosis Type:   Reason For Visit ; Confirmation:   Complaint of ; Clinical Dx:   Headache ; Classification:   Medical ; Clinical Service:   Emergency medicine ; Code:   PNED ; Probability:   0 ; Diagnosis Code:   [id number]      Rash  Date:   06/29/2020 ; Diagnosis Type:   Reason For Visit ; Confirmation:   Complaint of ; Clinical Dx:   Rash ; Classification:   Medical ; Clinical  Service:   Emergency medicine ; Code:   PNED ; Probability:   0 ; Diagnosis Code:   [id number]             -    Procedure History   (As Of: 06/30/2020 00:28:02 EDT)     [facility] Fall Risk Assessment Tool   Hx of falling last 3 months ED Fall :   No   Patient confused or disoriented ED Fall :   No   Patient intoxicated or sedated ED Fall :   No   Patient impaired gait ED Fall :   No   Use a mobility assistance device ED Fall :   No   Patient altered elimination ED Fall :   No   [facility] ED Fall Score :   0    Clerance Lav A-RN - 06/30/2020 0:26 EDT   ED Advance Directive   Advance Directive :  No   Clerance Lav A-RN - 06/30/2020 0:26 EDT   Med Hx   Medication List   (As Of: 06/30/2020 00:28:02 EDT)   Normal Order    diphenhydrAMINE 50 mg/mL Inj Soln 1 mL  :   diphenhydrAMINE 50 mg/mL Inj Soln 1 mL ; Status:   Completed ; Ordered As Mnemonic:   Benadryl ; Simple Display Line:   25 mg, 0.5 mL, IV Push, Once ; Ordering Provider:   Volney Presser C-MD; Catalog Code:   diphenhydrAMINE ; Order Dt/Tm:   06/29/2020 23:51:53 EDT          ketorolac 15 mg/mL Inj Soln 1 mL  :   ketorolac 15 mg/mL Inj Soln 1 mL ; Status:   Completed ; Ordered As Mnemonic:   Toradol ; Simple Display Line:   15 mg, 1 mL, IV Push, Once ; Ordering Provider:   Isabell Jarvis,  MATTHEW C-MD; Catalog Code:   ketorolac ; Order Dt/Tm:   06/29/2020 23:51:52 EDT          methylPREDNISolone 125 mg preservative-free Inj  :   methylPREDNISolone 125 mg preservative-free Inj ; Status:   Completed ; Ordered As Mnemonic:   methylPREDNISolone (Solu-MEDROL) ; Simple Display Line:   125 mg, 1 EA, IV Push, Once ; Ordering Provider:   Isabell Jarvis,  MATTHEW C-MD; Catalog Code:   methylPREDNISolone ; Order Dt/Tm:   06/29/2020 23:51:53 EDT          prochlorperazine 5 mg/mL Inj Soln 2 mL  :   prochlorperazine 5 mg/mL Inj Soln 2 mL ; Status:   Completed ; Ordered As Mnemonic:   Compazine ; Simple Display Line:   10 mg, 2 mL, IV Push, Once ; Ordering Provider:    Isabell Jarvis,  MATTHEW C-MD; Catalog Code:   prochlorperazine ; Order Dt/Tm:   06/29/2020 23:51:53 EDT          Sodium Chloride 0.9% intravenous solution Bolus  :   Sodium Chloride 0.9% intravenous solution Bolus ; Status:   Completed ; Ordered As Mnemonic:   Sodium Chloride 0.9% bolus ; Simple Display Line:   1,000 mL, 2000 mL/hr, IV Piggyback, Once ; Ordering Provider:   Isabell Jarvis,  MATTHEW C-MD; Catalog Code:   Sodium Chloride 0.9% ; Order Dt/Tm:   06/29/2020 23:51:53 EDT            Prescription/Discharge Order    docusate  :   docusate ; Status:   Prescribed ; Ordered As Mnemonic:   Colace 100 mg oral capsule ; Simple Display Line:   100 mg, 1 caps, Oral, BID, PRN: constipation, 60 caps, 1 Refill(s) ; Ordering Provider:   Leane Platt A-MD; Catalog Code:   docusate ; Order Dt/Tm:   06/05/2020 11:36:03 EDT          ibuprofen  :   ibuprofen ; Status:   Prescribed ; Ordered As Mnemonic:   ibuprofen 800 mg oral tablet ; Simple Display Line:   800 mg, 1 tabs, Oral, q8hr-INT, 60 tabs, 1 Refill(s) ; Ordering Provider:   Leane Platt A-MD; Catalog Code:   ibuprofen ; Order Dt/Tm:   06/05/2020 11:36:06 EDT            Home Meds    levothyroxine  :   levothyroxine ; Status:   Documented ; Ordered As Mnemonic:   levothyroxine ; Simple Display Line:   0.075 mg, Oral, Daily, 0 Refill(s) ; Catalog Code:   levothyroxine ; Order Dt/Tm:   05/23/2020  18:41:19 EDT          multivitamin, prenatal  :   multivitamin, prenatal ; Status:   Documented ; Ordered As Mnemonic:   Prenatal 1 ; Simple Display Line:   1 tabs, Oral, Daily, 0 Refill(s) ; Catalog Code:   multivitamin, prenatal ; Order Dt/Tm:   05/23/2020 18:41:39 EDT

## 2021-10-31 DIAGNOSIS — S0083XA Contusion of other part of head, initial encounter: Secondary | ICD-10-CM

## 2021-10-31 NOTE — ED Provider Notes (Signed)
[facility]  EMERGENCY DEPARTMENT ENCOUNTER      Pt Name: [name] [name]  MRN: [id number]  Birthdate 1990-03-16  Date of evaluation: 10/31/2021  Provider evaluation time: 10/31/21 2305  Provider: Achilles Dunk, MD    CHIEF COMPLAINT       Chief Complaint   Patient presents with    Facial Injury     C/o pt states child hit pt in the nose x2 days ago, ever since pt has had pain and right sided congestion with headache. Denies Blurred/double vision. Aox4, NAD          HISTORY OF PRESENT ILLNESS    31 year old white female presents emergency room for evaluation of head injury.  Patient states that 2 days ago her 30-month-old child accidentally head butted her.  Patient states she is got a small bruise to the right side of her nose and she has some mild congestion in her nose.  Patient denies any loss of consciousness blood thinner use nosebleeds or any other concerns.        Nursing Notes were reviewed.    REVIEW OF SYSTEMS     Review of Systems   HENT:          Minor contusion to the right nose   All other systems reviewed and are negative.    Except as noted above the remainder of the review of systems was reviewed and negative.     PAST MEDICAL HISTORY     Past Medical History:   Diagnosis Date    Hyperthyroidism        SURGICAL HISTORY     History reviewed. No pertinent surgical history.    CURRENT MEDICATIONS       Previous Medications    LEVOTHYROXINE (SYNTHROID) 100 MCG TABLET           ALLERGIES     Patient has no known allergies.    FAMILY HISTORY     History reviewed. No pertinent family history.     SOCIAL HISTORY       Social History     Tobacco Use    Smoking status: Never    Smokeless tobacco: Never   Vaping Use    Vaping Use: Never used   Substance and Sexual Activity    Alcohol use: Yes     Comment: rarely    Drug use: Never       SCREENINGS         Glasgow Coma Scale  Eye Opening: Spontaneous  Best Verbal Response: Oriented  Best Motor Response: Obeys commands  Glasgow Coma Scale Score: 15                      CIWA Assessment  BP: 129/87  Pulse: 99                 PHYSICAL EXAM       ED Triage Vitals [10/31/21 2221]   BP Temp Temp Source Pulse Resp SpO2 Height Weight - Scale   129/87 98.3 F (36.8 C) Oral 99 -- -- 5\' 5"  (1.651 m) 158 lb (71.7 kg)       Physical Exam  Constitutional:       Appearance: Normal appearance. She is normal weight.   HENT:      Head: Normocephalic.      Right Ear: Tympanic membrane normal.      Left Ear: Tympanic membrane normal.      Nose: Nose normal.  Comments: Minor ecchymosis to the right side of the nose.     Mouth/Throat:      Mouth: Mucous membranes are moist.      Pharynx: Oropharynx is clear.   Eyes:      Extraocular Movements: Extraocular movements intact.      Conjunctiva/sclera: Conjunctivae normal.      Pupils: Pupils are equal, round, and reactive to light.   Cardiovascular:      Rate and Rhythm: Normal rate and regular rhythm.      Pulses: Normal pulses.      Heart sounds: Normal heart sounds.   Pulmonary:      Effort: Pulmonary effort is normal.   Abdominal:      General: Abdomen is flat. Bowel sounds are normal.      Palpations: Abdomen is soft.   Musculoskeletal:         General: Normal range of motion.      Cervical back: Normal range of motion and neck supple.   Skin:     General: Skin is warm and dry.      Capillary Refill: Capillary refill takes less than 2 seconds.   Neurological:      General: No focal deficit present.      Mental Status: She is alert and oriented to person, place, and time.   Psychiatric:         Mood and Affect: Mood normal.         Behavior: Behavior normal.       Procedures    DIAGNOSTIC RESULTS     EKG: All EKG's are interpreted by the Emergency Department Physician who either signs or Co-signs this chart in the absence of a cardiologist.    RADIOLOGY:   No orders to display       LABS:  Labs Reviewed - No data to display    All other labs were within normal range or not returned as of this dictation.    EMERGENCY DEPARTMENT  COURSE/REASSESSMENT and MDM:   MDM  Number of Diagnoses or Management Options  Diagnosis management comments: Patient has a minor contusion to the right side of her nose there is no bony step-offs or no evidence of nasal bone fracture or facial fracture.  No septal hematoma.             FINAL IMPRESSION      1. Contusion of face, initial encounter          DISPOSITION/PLAN   DISPOSITION Decision To Discharge 10/31/2021 11:18:01 PM      PATIENT REFERRED TO:  Harden Mo, MD  [address]  [place] [address]  [phone]    In 1 week  As needed      DISCHARGE MEDICATIONS:  New Prescriptions    No medications on file     Controlled Substances Monitoring:     No flowsheet data found.    (Please note that portions of this note were completed with a voice recognition program.  Efforts were made to edit the dictations but occasionally words are mis-transcribed.)    Achilles Dunk, MD (electronically signed)  Attending Emergency Physician           Achilles Dunk, MD  10/31/21 [phone]

## 2021-11-01 ENCOUNTER — Inpatient Hospital Stay
Admit: 2021-11-01 | Discharge: 2021-11-01 | Disposition: A | Discharge status: Home / Self Care | Payer: MEDICAID | Attending: Emergency Medicine

## 2022-08-14 ENCOUNTER — Inpatient Hospital Stay
Admit: 2022-08-14 | Discharge: 2022-08-15 | Disposition: A | Discharge status: Home / Self Care | Payer: MEDICAID | Attending: Emergency Medicine

## 2022-08-14 DIAGNOSIS — S4492XA Injury of unspecified nerve at shoulder and upper arm level, left arm, initial encounter: Secondary | ICD-10-CM

## 2022-08-14 NOTE — Discharge Instructions (Addendum)
Return for increased tingling or weakness in your extremities or any concern.

## 2022-08-14 NOTE — ED Provider Notes (Addendum)
[facility]  EMERGENCY DEPARTMENT ENCOUNTER      Pt Name: [name] [name]  MRN: [id number]  Birthdate 1990-05-06  Date of evaluation: 08/14/2022  Provider evaluation time: 08/14/22 2023  Provider: Tami Ribas, MD    CHIEF COMPLAINT       Chief Complaint   Patient presents with    Fall     States while walking down stairs yesterday she moved quickly to avoid stepping on her cat and slipped causing her to fall down approximately ten stairs. Denies any LOC or head injury. Complains of back pain, left shoulder and left arm pain.          HISTORY OF PRESENT ILLNESS    HPI  32 year old female presents for evaluation of tingling in her left forearm and medial aspect of her hand that began after fall last night she is tripped over her cat and fell and states that both forearms, took most of the force she then noticed a little bit of neck and upper back discomfort today but is mostly concerned about some left arm tingling in her forearm to her fifth digit.  No difficulty with movement.  She has no concerns of fracture.  No headache no nausea or vomiting no loss of consciousness.  Nursing Notes were reviewed.    REVIEW OF SYSTEMS       Review of Systems   Constitutional:  Negative for fever.       Except as noted above the remainder of the review of systems was reviewed and negative.     PAST MEDICAL HISTORY     Past Medical History:   Diagnosis Date    Hyperthyroidism      SURGICAL HISTORY     No past surgical history on file.  CURRENT MEDICATIONS       Previous Medications    LEVOTHYROXINE (SYNTHROID) 100 MCG TABLET         ALLERGIES     Patient has no known allergies.  FAMILY HISTORY     No family history on file.  SOCIAL HISTORY       Social History     Socioeconomic History    Marital status: Single   Tobacco Use    Smoking status: Never    Smokeless tobacco: Never   Vaping Use    Vaping Use: Never used   Substance and Sexual Activity    Alcohol use: Yes     Comment: rarely    Drug use: Never     SCREENINGS        Glasgow Coma Scale  Eye Opening: Spontaneous  Best Verbal Response: Oriented  Best Motor Response: Obeys commands  Glasgow Coma Scale Score: 15             CIWA Assessment  BP: (!) 133/94  Pulse: 86           PHYSICAL EXAM       ED Triage Vitals [08/14/22 2004]   BP Temp Temp Source Pulse Respirations SpO2 Height Weight - Scale   (!) 133/94 98.4 F (36.9 C) Oral 86 16 100 % 1.651 m (5\' 5" ) 59 kg (130 lb)       Physical Exam  Constitutional:       Appearance: Normal appearance.   HENT:      Head: Normocephalic and atraumatic.      Nose: No rhinorrhea.   Eyes:      Conjunctiva/sclera: Conjunctivae normal.   Cardiovascular:      Rate  and Rhythm: Normal rate.   Abdominal:      General: Abdomen is flat.   Musculoskeletal:         General: Normal range of motion.      Comments: No midline cervical thoracic or lumbar discomfort to palpation no bony tenderness she has full range of motion shoulder elbow and wrist there is no snuffbox tenderness in the left or right area.  Normal radial median ulnar nerve testing of the left upper extremity   Neurological:      Mental Status: She is alert.         DIAGNOSTIC RESULTS       PROCEDURES:  Unless otherwise noted below, none     Procedures    EKG: All EKG's are interpreted by the Emergency Department Physician who either signs or Co-signs this chart in the absence of a cardiologist.    LABS:  Labs Reviewed - No data to display    All other labs were within normal range or not returned as of this dictation.    RADIOLOGY:   Non-plain film images such as CT, Ultrasound and MRI are read by the radiologist. Plain radiographic images are visualized and preliminarily interpreted by the emergency physician with the below findings:    Interpretation per the Radiologist below, if available at the time of this note:    No orders to display     EMERGENCY DEPARTMENT COURSE/REASSESSMENT and MDM:   Vitals:    Vitals:    08/14/22 2004   BP: (!) 133/94   Pulse: 86   Resp: 16   Temp: 98.4 F  (36.9 C)   TempSrc: Oral   SpO2: 100%   Weight: 59 kg (130 lb)   Height: 1.651 m (5\' 5" )       ED Course:    ED Course as of 08/14/22 2038   Thu Aug 14, 2022   2037 Patient with likely mild neuropraxia from striking her forearm on solid surface last night no physical exam evidence of fracture nor history of fracture. [CM]      ED Course User Index  [CM] Tami Ribas, MD       MDM      FINAL IMPRESSION      1. Neurapraxia of left upper extremity, initial encounter    2. Fall, initial encounter          DISPOSITION/PLAN   DISPOSITION Decision To Discharge 08/14/2022 08:38:52 PM      PATIENT REFERRED TO:  Filbert Berthold, DO  [address]  [place] [address]  [phone]    In 1 week        DISCHARGE MEDICATIONS:  New Prescriptions    No medications on file       (Please note that portions of this note were completed with a voice recognition program.  Efforts were made to edit the dictations but occasionally words are mis-transcribed.)    Tami Ribas, MD (electronically signed)  Attending Emergency Physician           Tami Ribas, MD  08/14/22 2036       Tami Ribas, MD  08/14/22 2038
